# Patient Record
Sex: Female | Born: 1980 | Race: Black or African American | Hispanic: No | Marital: Married | Smoking: Never smoker
Health system: Southern US, Community
[De-identification: ages and names within clinical notes are randomized; demographics above are authoritative.]

## PROBLEM LIST (undated history)

## (undated) DIAGNOSIS — Z9889 Other specified postprocedural states: Secondary | ICD-10-CM

## (undated) DIAGNOSIS — R112 Nausea with vomiting, unspecified: Secondary | ICD-10-CM

## (undated) DIAGNOSIS — B977 Papillomavirus as the cause of diseases classified elsewhere: Secondary | ICD-10-CM

## (undated) DIAGNOSIS — D509 Iron deficiency anemia, unspecified: Secondary | ICD-10-CM

## (undated) DIAGNOSIS — L02419 Cutaneous abscess of limb, unspecified: Secondary | ICD-10-CM

## (undated) DIAGNOSIS — L02429 Furuncle of limb, unspecified: Secondary | ICD-10-CM

## (undated) DIAGNOSIS — F419 Anxiety disorder, unspecified: Secondary | ICD-10-CM

## (undated) DIAGNOSIS — Z973 Presence of spectacles and contact lenses: Secondary | ICD-10-CM

## (undated) DIAGNOSIS — O139 Gestational [pregnancy-induced] hypertension without significant proteinuria, unspecified trimester: Secondary | ICD-10-CM

## (undated) DIAGNOSIS — J111 Influenza due to unidentified influenza virus with other respiratory manifestations: Secondary | ICD-10-CM

## (undated) DIAGNOSIS — N926 Irregular menstruation, unspecified: Principal | ICD-10-CM

## (undated) DIAGNOSIS — E559 Vitamin D deficiency, unspecified: Secondary | ICD-10-CM

## (undated) DIAGNOSIS — L723 Sebaceous cyst: Secondary | ICD-10-CM

## (undated) DIAGNOSIS — Z9851 Tubal ligation status: Secondary | ICD-10-CM

## (undated) HISTORY — PX: TUBAL LIGATION: SHX77

## (undated) HISTORY — DX: Gestational (pregnancy-induced) hypertension without significant proteinuria, unspecified trimester: O13.9

## (undated) HISTORY — DX: Cutaneous abscess of limb, unspecified: L02.419

## (undated) HISTORY — DX: Irregular menstruation, unspecified: N92.6

---

## 2000-05-01 ENCOUNTER — Emergency Department (HOSPITAL_COMMUNITY): Admission: EM | Admit: 2000-05-01 | Discharge: 2000-05-01 | Payer: Self-pay | Admitting: Emergency Medicine

## 2000-09-28 ENCOUNTER — Other Ambulatory Visit: Admission: RE | Admit: 2000-09-28 | Discharge: 2000-09-28 | Payer: Self-pay | Admitting: Family Medicine

## 2001-04-06 ENCOUNTER — Other Ambulatory Visit: Admission: RE | Admit: 2001-04-06 | Discharge: 2001-04-06 | Payer: Self-pay | Admitting: Family Medicine

## 2001-07-21 ENCOUNTER — Emergency Department (HOSPITAL_COMMUNITY): Admission: EM | Admit: 2001-07-21 | Discharge: 2001-07-21 | Payer: Self-pay | Admitting: Emergency Medicine

## 2001-09-29 ENCOUNTER — Emergency Department (HOSPITAL_COMMUNITY): Admission: EM | Admit: 2001-09-29 | Discharge: 2001-09-29 | Payer: Self-pay | Admitting: Emergency Medicine

## 2002-03-21 ENCOUNTER — Emergency Department (HOSPITAL_COMMUNITY): Admission: EM | Admit: 2002-03-21 | Discharge: 2002-03-21 | Payer: Self-pay | Admitting: *Deleted

## 2002-03-26 ENCOUNTER — Emergency Department (HOSPITAL_COMMUNITY): Admission: EM | Admit: 2002-03-26 | Discharge: 2002-03-26 | Payer: Self-pay | Admitting: *Deleted

## 2006-01-14 ENCOUNTER — Other Ambulatory Visit: Admission: RE | Admit: 2006-01-14 | Discharge: 2006-01-14 | Payer: Self-pay | Admitting: Family Medicine

## 2006-01-14 ENCOUNTER — Ambulatory Visit: Payer: Self-pay | Admitting: Family Medicine

## 2007-05-18 ENCOUNTER — Encounter: Payer: Self-pay | Admitting: Family Medicine

## 2007-06-08 ENCOUNTER — Encounter: Payer: Self-pay | Admitting: Family Medicine

## 2008-03-14 DIAGNOSIS — J301 Allergic rhinitis due to pollen: Secondary | ICD-10-CM

## 2008-05-13 ENCOUNTER — Inpatient Hospital Stay (HOSPITAL_COMMUNITY): Admission: AD | Admit: 2008-05-13 | Discharge: 2008-05-16 | Payer: Self-pay | Admitting: Obstetrics and Gynecology

## 2008-05-14 ENCOUNTER — Encounter (INDEPENDENT_AMBULATORY_CARE_PROVIDER_SITE_OTHER): Payer: Self-pay | Admitting: Obstetrics and Gynecology

## 2008-05-20 ENCOUNTER — Encounter: Admission: RE | Admit: 2008-05-20 | Discharge: 2008-06-06 | Payer: Self-pay | Admitting: Obstetrics and Gynecology

## 2008-05-31 ENCOUNTER — Inpatient Hospital Stay (HOSPITAL_COMMUNITY): Admission: AD | Admit: 2008-05-31 | Discharge: 2008-05-31 | Payer: Self-pay | Admitting: Obstetrics & Gynecology

## 2009-10-06 ENCOUNTER — Ambulatory Visit: Payer: Self-pay | Admitting: Family Medicine

## 2009-10-06 ENCOUNTER — Other Ambulatory Visit: Admission: RE | Admit: 2009-10-06 | Discharge: 2009-10-06 | Payer: Self-pay | Admitting: Family Medicine

## 2009-10-06 LAB — CONVERTED CEMR LAB
Glucose, Urine, Semiquant: NEGATIVE
Protein, U semiquant: 100
Urobilinogen, UA: 0.2
WBC Urine, dipstick: NEGATIVE

## 2009-10-13 ENCOUNTER — Other Ambulatory Visit: Admission: RE | Admit: 2009-10-13 | Discharge: 2009-10-13 | Payer: Self-pay | Admitting: Family Medicine

## 2009-10-13 ENCOUNTER — Ambulatory Visit: Payer: Self-pay | Admitting: Family Medicine

## 2009-10-13 DIAGNOSIS — R87625 Unsatisfactory cytologic smear of vagina: Secondary | ICD-10-CM

## 2010-02-27 ENCOUNTER — Emergency Department (HOSPITAL_COMMUNITY): Admission: EM | Admit: 2010-02-27 | Discharge: 2010-02-27 | Payer: Self-pay | Admitting: Family Medicine

## 2010-07-07 NOTE — Assessment & Plan Note (Signed)
Summary: pap specimen recollection- room3   Vital Signs:  Patient profile:   30 year old female Menstrual status:  regular Height:      63.5 inches Weight:      141.50 pounds BMI:     24.76 O2 Sat:      99 % on Room air Pulse rate:   79 / minute Resp:     16 per minute BP sitting:   120 / 80  (left arm)  Vitals Entered By: Adella Hare LPN (Oct 13, 9145 3:24 PM) CC: pap specimen recollection Is Patient Diabetic? No Pain Assessment Patient in pain? no        Primary Provider:  Esperanza Sheets PA  CC:  pap specimen recollection.  History of Present Illness: Pt is here today for repeat pap smear collection due to inadequate specimen. No current complaints or concerns.  Current Medications (verified): 1)  Ortho Tri-Cyclen (28) 0.18/0.215/0.25 Mg-35 Mcg Tabs (Norgestim-Eth Estrad Triphasic) .... Take 1 Daily 2)  Zithromax Z-Pak 250 Mg Tabs (Azithromycin) .... Take Once Daily As Directed  Allergies (verified): 1)  ! Pcn  Physical Exam  Genitalia:  Normal introitus for age, no external lesions, no vaginal discharge, mucosa pink and moist, no vaginal or cervical lesions, no vaginal atrophy, no friaility or hemorrhage.   Impression & Recommendations:  Problem # 1:  UNSATISFACTORY VAGINAL CYTOLOGY SMEAR (ICD-795.18) Assessment Comment Only  await pathology results of recollection  Orders: No Charge Patient Arrived (NCPA0) (NCPA0)  Complete Medication List: 1)  Ortho Tri-cyclen (28) 0.18/0.215/0.25 Mg-35 Mcg Tabs (Norgestim-eth estrad triphasic) .... Take 1 daily 2)  Zithromax Z-pak 250 Mg Tabs (Azithromycin) .... Take once daily as directed

## 2010-07-07 NOTE — Letter (Signed)
Summary: Historic Patient File  Historic Patient File   Imported By: Curtis Sites 12/29/2009 15:13:18  _____________________________________________________________________  External Attachment:    Type:   Image     Comment:   External Document

## 2010-07-07 NOTE — Letter (Signed)
Summary: demographic   demographic   Imported By: Curtis Sites 12/29/2009 15:05:07  _____________________________________________________________________  External Attachment:    Type:   Image     Comment:   External Document

## 2010-07-07 NOTE — Letter (Signed)
Summary: history and physical  history and physical   Imported By: Curtis Sites 12/29/2009 15:12:51  _____________________________________________________________________  External Attachment:    Type:   Image     Comment:   External Document

## 2010-07-07 NOTE — Assessment & Plan Note (Signed)
Summary: new patient, physical   Vital Signs:  Patient profile:   30 year old female Menstrual status:  regular LMP:     09/29/2009 Height:      63.5 inches Weight:      137.75 pounds BMI:     24.11 O2 Sat:      100 % on Room air Pulse rate:   85 / minute Resp:     16 per minute BP sitting:   120 / 72  (left arm)  Vitals Entered By: Adella Hare LPN (Oct 06, 452 9:46 AM) CC: new patient/ physical Is Patient Diabetic? No Pain Assessment Patient in pain? no      LMP (date): 09/29/2009     Menstrual Status regular Enter LMP: 09/29/2009 Last PAP Result normal   Primary Provider:  Esperanza Sheets PA  CC:  new patient/ physical.  History of Present Illness: New pt here to establish care with new PCP. Pt is here today for a physical.  Last physical approx 1 1/2 yrs ago.  No prev abn pap.  Took OCP's in past.  No current birth control.  No problems with OCP just didn't continue on them.    Has been having some yellow nasal congestion x 3 weeks.  Hx of seasonal allergies, & has been sneezing, but now has developed thicker yellow mucus. Using Zyrtec for allergies & does help with the allergy syptoms.  Last Td "couple mos" ago.   Current Medications (verified): 1)  None  Allergies (verified): 1)  ! Pcn  Past History:  Past medical, surgical, family and social histories (including risk factors) reviewed for relevance to current acute and chronic problems.  Past Medical History: Reviewed history from 05/18/2007 and no changes required. Current Problems:  ALLERGIC RHINITIS, SEASONAL (ICD-477.0)  Past Surgical History: Reviewed history from 05/18/2007 and no changes required. none  Family History: Reviewed history from 05/18/2007 and no changes required. mom living- htn dad living- htn  one brother- healthy  Social History: Reviewed history from 05/18/2007 and no changes required. 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 ENT:  Complains of nasal congestion; denies earache, postnasal drainage, sinus pressure, and sore throat. CV:  Denies chest pain or discomfort and palpitations. Resp:  Denies cough and shortness of breath. GI:  Denies abdominal pain, bloody stools, change in bowel habits, constipation, dark tarry stools, loss of appetite, nausea, and vomiting. GU:  Denies abnormal vaginal bleeding, discharge, dysuria, and incontinence. MS:  Denies joint pain, low back pain, and mid back pain. Derm:  Denies lesion(s) and rash. Neuro:  Denies numbness and tingling. Psych:  Denies anxiety and depression. Allergy:  Complains of seasonal allergies and sneezing.  Physical Exam  General:  Well-developed,well-nourished,in no acute distress; alert,appropriate and cooperative throughout examination Head:  Normocephalic and atraumatic without obvious abnormalities. No apparent alopecia or balding. Eyes:  No corneal or conjunctival inflammation noted. EOMI. Perrla. Funduscopic exam benign, without hemorrhages, exudates or papilledema. Vision grossly normal. Ears:  External ear exam shows no significant lesions or deformities.  Otoscopic examination reveals clear canals, tympanic membranes are intact bilaterally without bulging, retraction, inflammation or discharge. Hearing is grossly normal bilaterally. Nose:  External nasal examination shows no deformity or inflammation. Nasal mucosa are pink and moist without lesions or exudates. Mouth:  Oral mucosa and oropharynx without lesions or exudates.  Teeth in good repair. Neck:  No deformities, masses, or tenderness noted.no thyromegaly.   Chest Wall:  no deformities and no mass.   Breasts:  No mass, nodules, thickening, tenderness, bulging, retraction, inflamation, nipple discharge or skin changes noted.   Lungs:  Normal respiratory  effort, chest expands symmetrically. Lungs are clear to auscultation, no crackles or wheezes. Heart:  Normal rate and regular rhythm. S1 and S2 normal without gallop, murmur, click, rub or other extra sounds. Abdomen:  Bowel sounds positive,abdomen soft and non-tender without masses, organomegaly or hernias noted. Genitalia:  Normal introitus for age, no external lesions, no vaginal discharge, mucosa pink and moist, no vaginal or cervical lesions, no vaginal atrophy, no friaility or hemorrhage, normal uterus size and position, no adnexal masses or tenderness Pulses:  R posterior tibial normal, R dorsalis pedis normal, L posterior tibial normal, and L dorsalis pedis normal.   Extremities:  No edema. Neurologic:  alert & oriented X3, sensation intact to light touch, gait normal, and DTRs symmetrical and normal.   Skin:  Intact without suspicious lesions or rashes Cervical Nodes:  No lymphadenopathy noted Axillary Nodes:  No palpable lymphadenopathy Psych:  Cognition and judgment appear intact. Alert and cooperative with normal attention span and concentration. No apparent delusions, illusions, hallucinations   Impression & Recommendations:  Problem # 1:  Preventive Health Care (ICD-V70.0) Discussed birth control options & risk of unplanned pregnancy.  Pt agreed to restart OCP's. Continue periodic SBE.  Problem # 2:  ALLERGIC RHINITIS, SEASONAL (ICD-477.0) Assessment: Comment Only Will continue Zyrtec as needed.  Problem # 3:  SINUSITIS, ACUTE (ICD-461.9) Assessment: New  Her updated medication list for this problem includes:    Zithromax Z-pak 250 Mg Tabs (Azithromycin) .Marland Kitchen... Take once daily as directed  Complete Medication List: 1)  Ortho Tri-cyclen (28) 0.18/0.215/0.25 Mg-35 Mcg Tabs (Norgestim-eth estrad triphasic) .... Take 1 daily 2)  Zithromax Z-pak 250 Mg Tabs (Azithromycin) .... Take once daily as directed  Other Orders: Urinalysis ([id number]) Pap Smear (78295)  Patient  Instructions: 1)  Physical in one year.  Recheck sooner as needed. 2)  Continue Zyrtec as needed for allergies. 3)  I have prescribed an antibiotic for your sinuses. 4)  I have prescribed you birth control pills.  Use condoms as back up for the first month your on the pill. Prescriptions: ZITHROMAX Z-PAK 250 MG TABS (AZITHROMYCIN) take once daily as directed  #1 pack x 0   Entered and Authorized by:   Esperanza Sheets PA   Signed by:   Esperanza Sheets PA on 10/06/2009   Method used:   Electronically to        [facility]. [address] [phone]* (retail)       [address] [place], [place]  [address]       Ph: [phone]       Fax: [phone]   RxID:   [id number] ORTHO TRI-CYCLEN (28) 0.18/0.215/0.25 MG-35 MCG TABS (NORGESTIM-ETH ESTRAD TRIPHASIC) take 1 daily  #1 pack x 11   Entered and Authorized by:   Esperanza Sheets PA   Signed by:   Esperanza Sheets PA on 10/06/2009   Method used:   Electronically to        [facility] [address] [phone]* (retail)       [address], [place]  [address]       Ph: [phone]       Fax: [phone]  RxID:   [id number]   Laboratory Results   Urine Tests  Date/Time Received: Oct 06, 2009  Date/Time Reported: Oct 06, 2009   Routine Urinalysis   Color: yellow Appearance: Clear Glucose: negative   (Normal Range: Negative) Bilirubin: negative   (Normal Range: Negative) Ketone: negative   (Normal Range: Negative) Spec. Gravity: 1.025   (Normal Range: 1.003-1.035) Blood: negative   (Normal Range: Negative) pH: 7.0   (Normal Range: 5.0-8.0) Protein: 100   (Normal Range: Negative) Urobilinogen: 0.2   (Normal Range: 0-1) Nitrite: negative   (Normal Range: Negative) Leukocyte Esterace: negative   (Normal Range: Negative)

## 2010-07-07 NOTE — Letter (Signed)
Summary: progress notes  progress notes   Imported By: Curtis Sites 12/29/2009 15:05:53  _____________________________________________________________________  External Attachment:    Type:   Image     Comment:   External Document

## 2010-07-07 NOTE — Letter (Signed)
Summary: labs  labs   Imported By: Curtis Sites 12/29/2009 15:05:30  _____________________________________________________________________  External Attachment:    Type:   Image     Comment:   External Document

## 2010-10-23 NOTE — Discharge Summary (Signed)
[name], [name]              ACCOUNT NO.:  [id number]   MEDICAL RECORD NO.:  [id number]          PATIENT TYPE:  INP   LOCATION:  9312                          FACILITY:  [facility]   PHYSICIAN:  Maxie Better, M.D.DATE OF BIRTH:  27-Nov-1980   DATE OF ADMISSION:  05/13/2008  DATE OF DISCHARGE:  05/16/2008                               DISCHARGE SUMMARY   ADMISSION DIAGNOSES:  1. Severe intrauterine growth restriction.  2. Intrauterine gestation at 35-3/7 weeks.   DISCHARGE DIAGNOSES:  1. Intrauterine gestation at 35-3/7 weeks, delivered.  2. Severe intrauterine growth restriction.   PROCEDURE:  Spontaneous vaginal delivery.   HISTORY OF PRESENT ILLNESS:  A 30 year old gravida 1, para 0 female at  35-3/7 weeks, admitted for induction of labor secondary to intrauterine  growth restriction.  Ultrasound in the office revealed the fetus to be 3  pounds 8 ounces with 0 percentile growth, on prior scan on April 16, 2008, was 3 pounds 1 ounce, which was at the 22nd percentile at that  time.  Biophysical profile was normal.  Dopplers were normal.  Group B  strep culture had been done on May 13, 2008, had not been resulted.  The patient was followed for serial growth and had been stressed upon to  increase her protein intake and her weight gain.  The patient was a  nonsmoker.  She had no substance use, and she did not have hypertension.   HOSPITAL COURSE:  The patient was admitted for cervical ripening.  She  was started on clindamycin due to unknown group B strep status, and her  blood pressure on admission was 154/90, which was thought possibly  secondary to anxiety over the resultant plan for delivery.  However, PIH  labs obtained on May 14, 2008, were normal.  The patient had Cytotec  placement.  She subsequently had spontaneous rupture of membranes.  Her  blood pressure was on May 14, 2008, 160/92.  PIH labs, as stated,  were normal.  She was 4-5, 80%, -1.   Internal pressure catheter,  internal scalp electrodes were all placed.  She was contracting every 2-  6 minutes.  She was in active labor.  The patient subsequently  progressed quickly.  She had some variable decelerations.  She  subsequently delivered a live female, cord around the neck times one  reducible over a second-degree perineal laceration, Apgars of 8 and 9.  The placenta was small, intact, was sent to Pathology.  Her perineal  laceration was repaired.  The baby went to the NICU due to the weight of  1725 g.  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  Her postpartum course was notable for blood pressure ranging  from 130 to 153 over 72 to 90 on postpartum days #1 and 2.  Her CBC on  postpartum day #1 showed a hemoglobin of 10, hematocrit 28.9, platelet  count of 141,000, and white count of 10.7.  She otherwise was feeling  well.  The patient had no other signs or symptoms of preeclampsia and  was able to be discharged home.  DISPOSITION:  Home.   CONDITION:  Stable.   DISCHARGE MEDICATIONS:  Over-the-counter ibuprofen, prenatal vitamins 1  p.o. daily.   FOLLOWUP APPOINTMENTS:  [facility] in 6 weeks.  Followup for blood  pressure check in a week's time.   DISCHARGE INSTRUCTIONS:  Per the postpartum booklet given.      Maxie Better, M.D.  Electronically Signed     [place] [place]/MED[facility]  D:  06/23/2008  T:  06/23/2008  Job:  [id number]

## 2011-03-12 LAB — CBC
HCT: 28.9 % — ABNORMAL LOW (ref 36.0–46.0)
HCT: 34.8 % — ABNORMAL LOW (ref 36.0–46.0)
Hemoglobin: 11.6 g/dL — ABNORMAL LOW (ref 12.0–15.0)
MCHC: 33.3 g/dL (ref 30.0–36.0)
MCHC: 33.5 g/dL (ref 30.0–36.0)
MCV: 89.4 fL (ref 78.0–100.0)
MCV: 89.6 fL (ref 78.0–100.0)
MCV: 89.6 fL (ref 78.0–100.0)
Platelets: 178 10*3/uL (ref 150–400)
RBC: 3.23 MIL/uL — ABNORMAL LOW (ref 3.87–5.11)
RDW: 14.4 % (ref 11.5–15.5)
WBC: 10.7 10*3/uL — ABNORMAL HIGH (ref 4.0–10.5)

## 2011-03-12 LAB — URINALYSIS, ROUTINE W REFLEX MICROSCOPIC
Bilirubin Urine: NEGATIVE
Ketones, ur: NEGATIVE mg/dL
Protein, ur: >300 mg/dL — AB
Urobilinogen, UA: 0.2 mg/dL (ref 0.0–1.0)

## 2011-03-12 LAB — COMPREHENSIVE METABOLIC PANEL
AST: 26 U/L (ref 0–37)
Albumin: 2.8 g/dL — ABNORMAL LOW (ref 3.5–5.2)
Calcium: 9 mg/dL (ref 8.4–10.5)
Creatinine, Ser: 0.49 mg/dL (ref 0.4–1.2)
GFR calc Af Amer: >60 mL/min (ref >60)
GFR calc non Af Amer: >60 mL/min (ref >60)

## 2011-03-12 LAB — URINE CULTURE: Colony Count: >=100000

## 2011-03-12 LAB — URINE MICROSCOPIC-ADD ON

## 2011-03-12 LAB — URIC ACID: Uric Acid, Serum: 5.6 mg/dL (ref 2.4–7.0)

## 2011-04-09 ENCOUNTER — Encounter: Payer: Self-pay | Admitting: Physician Assistant

## 2011-04-13 ENCOUNTER — Ambulatory Visit (INDEPENDENT_AMBULATORY_CARE_PROVIDER_SITE_OTHER): Payer: Commercial Managed Care - PPO | Admitting: Family Medicine

## 2011-04-13 ENCOUNTER — Other Ambulatory Visit: Payer: Self-pay | Admitting: Family Medicine

## 2011-04-13 ENCOUNTER — Encounter: Payer: Self-pay | Admitting: Family Medicine

## 2011-04-13 VITALS — BP 130/80 | HR 97 | Resp 16 | Ht 63.5 in | Wt 149.4 lb

## 2011-04-13 DIAGNOSIS — Z13220 Encounter for screening for lipoid disorders: Secondary | ICD-10-CM

## 2011-04-13 DIAGNOSIS — J309 Allergic rhinitis, unspecified: Secondary | ICD-10-CM

## 2011-04-13 DIAGNOSIS — M899 Disorder of bone, unspecified: Secondary | ICD-10-CM

## 2011-04-13 DIAGNOSIS — M949 Disorder of cartilage, unspecified: Secondary | ICD-10-CM

## 2011-04-13 DIAGNOSIS — J301 Allergic rhinitis due to pollen: Secondary | ICD-10-CM

## 2011-04-13 DIAGNOSIS — E559 Vitamin D deficiency, unspecified: Secondary | ICD-10-CM

## 2011-04-13 DIAGNOSIS — E663 Overweight: Secondary | ICD-10-CM

## 2011-04-13 DIAGNOSIS — Z6825 Body mass index (BMI) 25.0-25.9, adult: Secondary | ICD-10-CM

## 2011-04-13 DIAGNOSIS — R5383 Other fatigue: Secondary | ICD-10-CM

## 2011-04-13 DIAGNOSIS — R5381 Other malaise: Secondary | ICD-10-CM

## 2011-04-13 DIAGNOSIS — N63 Unspecified lump in unspecified breast: Secondary | ICD-10-CM

## 2011-04-13 LAB — LIPID PANEL
LDL Cholesterol: 124 mg/dL — ABNORMAL HIGH (ref 0–99)
Total CHOL/HDL Ratio: 4.6 Ratio
VLDL: 24 mg/dL (ref 0–40)

## 2011-04-13 LAB — BASIC METABOLIC PANEL
BUN: 14 mg/dL (ref 6–23)
CO2: 26 mEq/L (ref 19–32)
Chloride: 104 mEq/L (ref 96–112)
Creat: 0.69 mg/dL (ref 0.50–1.10)
Potassium: 4 mEq/L (ref 3.5–5.3)

## 2011-04-13 LAB — CBC WITH DIFFERENTIAL/PLATELET
Eosinophils Relative: 2 % (ref 0–5)
HCT: 34 % — ABNORMAL LOW (ref 36.0–46.0)
Hemoglobin: 10.5 g/dL — ABNORMAL LOW (ref 12.0–15.0)
Lymphocytes Relative: 28 % (ref 12–46)
Lymphs Abs: 2.1 10*3/uL (ref 0.7–4.0)
MCV: 81.7 fL (ref 78.0–100.0)
Monocytes Absolute: 0.6 10*3/uL (ref 0.1–1.0)
Monocytes Relative: 8 % (ref 3–12)
Neutro Abs: 4.6 10*3/uL (ref 1.7–7.7)
RBC: 4.16 MIL/uL (ref 3.87–5.11)
WBC: 7.4 10*3/uL (ref 4.0–10.5)

## 2011-04-13 MED ORDER — FLUTICASONE PROPIONATE 50 MCG/ACT NA SUSP: 2.0000 | Freq: Every day | NASAL | Status: DC

## 2011-04-13 NOTE — Progress Notes (Signed)
  Subjective:    Patient ID: [name], female    DOB: 04/19/1981, 30 y.o.   MRN: [id number]  HPI The PT is here for follow up and re-evaluation of chronic medical conditions, medication management and review of any available recent lab and radiology data.  Preventive health is updated, specifically  Cancer screening and Immunization.   C/o uncontrolled allergy symptoms and requests that a nasal spray be prescribed, c/o nasal congestion with clear drainage, denies fever, chills, sore throat or cough. C/o enlarged left breast which she has been unaware of in the past, noticed around the time of her last cycle, also c/o lump in left axilla which is non tender. No known family /o breast cancer. Not currently on an exercise regime, eating habits need improvement, now overweight    Review of Systems See HPI Denies recent fever or chills. Denies chest congestion, productive cough or wheezing. Denies chest pains, palpitations and leg swelling Denies abdominal pain, nausea, vomiting,diarrhea or constipation.   Denies dysuria, frequency, hesitancy or incontinence. Denies joint pain, swelling and limitation in mobility. Denies headaches, seizures, numbness, or tingling. Denies depression, anxiety or insomnia. Denies skin break down or rash.        Objective:   Physical Exam  Patient alert and oriented and in no cardiopulmonary distress.  HEENT: No facial asymmetry, EOMI, no sinus tenderness,  oropharynx pink and moist.  Neck supple no adenopathy.erythema and edema of nasal mucosa.  Chest: Clear to auscultation bilaterally. Breast: no palpable mass in either breast. Left breast visibly larger than right. Lump in left axilla. No nipple d/c CVS: S1, S2 no murmurs, no S3.  ABD: Soft non tender. Bowel sounds normal.  Ext: No edema  MS: Adequate ROM spine, shoulders, hips and knees.  Skin: Intact, no ulcerations or rash noted.  Psych: Good eye contact, normal affect. Memory  intact not anxious or depressed appearing.  CNS: CN 2-12 intact, power, tone and sensation normal throughout.       Assessment & Plan:

## 2011-04-13 NOTE — Patient Instructions (Addendum)
CPE in 2 to 3 months.  Fasting cbc, chem 7, lipid, tsh and vit D today   It is important that you exercise regularly at least 30 minutes 5 times a week. If you develop chest pain, have severe difficulty breathing, or feel very tired, stop exercising immediately and seek medical attention    A healthy diet is rich in fruit, vegetables and whole grains. Poultry fish, nuts and beans are a healthy choice for protein rather then red meat. A low sodium diet and drinking 64 ounces of water daily is generally recommended. Oils and sweet should be limited. Carbohydrates especially for those who are diabetic or overweight, should be limited to 30-45 gram per meal. It is important to eat on a regular schedule, at least 3 times daily. Snacks should be primarily fruits, vegetables or nuts.  Weight loss goal of 8 to 10 pounds in the next 6 months  Medication prescribed for allergies.  You may call in for zyrtec or claritin to [facility] pharmacy

## 2011-04-14 DIAGNOSIS — N63 Unspecified lump in unspecified breast: Secondary | ICD-10-CM | POA: Insufficient documentation

## 2011-04-14 DIAGNOSIS — E559 Vitamin D deficiency, unspecified: Secondary | ICD-10-CM | POA: Insufficient documentation

## 2011-04-14 MED ORDER — VITAMIN D3 1.25 MG (50000 UT) PO CAPS: 50000.0000 [IU] | ORAL_CAPSULE | ORAL | Status: DC

## 2011-04-14 NOTE — Assessment & Plan Note (Signed)
New dx based on lab, will supplement x 6 months

## 2011-04-14 NOTE — Assessment & Plan Note (Signed)
Concerned about lump in left axilla, not painful, and enlarged left breast, will refer for [place]

## 2011-04-19 LAB — ANEMIA PANEL
Ferritin: 11 ng/mL (ref 10–291)
Folate: 9.7 ng/mL
TIBC: 402 ug/dL (ref 250–470)
UIBC: 340 ug/dL (ref 125–400)
Vitamin B-12: 864 pg/mL (ref 211–911)

## 2011-04-21 ENCOUNTER — Other Ambulatory Visit (HOSPITAL_COMMUNITY): Payer: Self-pay | Admitting: Family Medicine

## 2011-04-21 ENCOUNTER — Ambulatory Visit (HOSPITAL_COMMUNITY)
Admission: RE | Admit: 2011-04-21 | Discharge: 2011-04-21 | Disposition: A | Discharge status: Home / Self Care | Payer: 59 | Source: Ambulatory Visit | Attending: Family Medicine | Admitting: Family Medicine

## 2011-04-21 ENCOUNTER — Other Ambulatory Visit: Payer: Self-pay | Admitting: Family Medicine

## 2011-04-21 DIAGNOSIS — N63 Unspecified lump in unspecified breast: Secondary | ICD-10-CM | POA: Insufficient documentation

## 2011-04-21 DIAGNOSIS — R2230 Localized swelling, mass and lump, unspecified upper limb: Secondary | ICD-10-CM

## 2011-04-21 NOTE — Progress Notes (Signed)
Lab added, patient aware

## 2011-06-09 ENCOUNTER — Encounter: Payer: Self-pay | Admitting: Family Medicine

## 2011-06-17 ENCOUNTER — Encounter: Payer: Commercial Managed Care - PPO | Admitting: Family Medicine

## 2011-09-15 ENCOUNTER — Encounter: Payer: 59 | Admitting: Family Medicine

## 2011-10-15 ENCOUNTER — Encounter: Payer: 59 | Admitting: Family Medicine

## 2011-11-19 ENCOUNTER — Encounter: Payer: 59 | Admitting: Family Medicine

## 2011-12-31 ENCOUNTER — Ambulatory Visit (INDEPENDENT_AMBULATORY_CARE_PROVIDER_SITE_OTHER): Payer: 59 | Admitting: Family Medicine

## 2011-12-31 ENCOUNTER — Encounter: Payer: Self-pay | Admitting: Family Medicine

## 2011-12-31 ENCOUNTER — Other Ambulatory Visit (HOSPITAL_COMMUNITY)
Admission: RE | Admit: 2011-12-31 | Discharge: 2011-12-31 | Disposition: A | Discharge status: Home / Self Care | Payer: 59 | Source: Ambulatory Visit | Attending: Family Medicine | Admitting: Family Medicine

## 2011-12-31 ENCOUNTER — Other Ambulatory Visit: Payer: Self-pay | Admitting: Family Medicine

## 2011-12-31 VITALS — BP 132/78 | HR 75 | Resp 18 | Ht 63.5 in | Wt 149.0 lb

## 2011-12-31 DIAGNOSIS — E559 Vitamin D deficiency, unspecified: Secondary | ICD-10-CM

## 2011-12-31 DIAGNOSIS — Z01419 Encounter for gynecological examination (general) (routine) without abnormal findings: Secondary | ICD-10-CM | POA: Insufficient documentation

## 2011-12-31 DIAGNOSIS — Z139 Encounter for screening, unspecified: Secondary | ICD-10-CM

## 2011-12-31 DIAGNOSIS — D649 Anemia, unspecified: Secondary | ICD-10-CM

## 2011-12-31 DIAGNOSIS — Z0000 Encounter for general adult medical examination without abnormal findings: Secondary | ICD-10-CM

## 2011-12-31 DIAGNOSIS — Z23 Encounter for immunization: Secondary | ICD-10-CM

## 2011-12-31 LAB — BASIC METABOLIC PANEL
Calcium: 9.5 mg/dL (ref 8.4–10.5)
Glucose, Bld: 86 mg/dL (ref 70–99)
Potassium: 4.2 mEq/L (ref 3.5–5.3)
Sodium: 136 mEq/L (ref 135–145)

## 2011-12-31 LAB — LIPID PANEL
Cholesterol: 209 mg/dL — ABNORMAL HIGH (ref 0–200)
HDL: 49 mg/dL (ref >39)
Total CHOL/HDL Ratio: 4.3 Ratio
Triglycerides: 68 mg/dL (ref <150)
VLDL: 14 mg/dL (ref 0–40)

## 2011-12-31 LAB — CBC
Hemoglobin: 10.2 g/dL — ABNORMAL LOW (ref 12.0–15.0)
MCH: 23.2 pg — ABNORMAL LOW (ref 26.0–34.0)
MCHC: 31.2 g/dL (ref 30.0–36.0)
Platelets: 335 10*3/uL (ref 150–400)
RBC: 4.4 MIL/uL (ref 3.87–5.11)

## 2011-12-31 NOTE — Patient Instructions (Addendum)
Annual exam in 1 year.  It is important that you exercise regularly at least 30 minutes 5 times a week. If you develop chest pain, have severe difficulty breathing, or feel very tired, stop exercising immediately and seek medical attention    A healthy diet is rich in fruit, vegetables and whole grains. Poultry fish, nuts and beans are a healthy choice for protein rather then red meat. A low sodium diet and drinking 64 ounces of water daily is generally recommended. Oils and sweet should be limited. Carbohydrates especially for those who are diabetic or overweight, should be limited to 30-45 gram per meal. It is important to eat on a regular schedule, at least 3 times daily. Snacks should be primarily fruits, vegetables or nuts.   CBc, fastiing chem 7, lipid, vit D as soon as possible,  TdAP needed

## 2011-12-31 NOTE — Assessment & Plan Note (Signed)
Discussed the importance of commitment to daily physical activity, healthy diet , and maintaining appropriate weight. general home safety also discussed. No desire for contraception at this time as pt desires to have children

## 2011-12-31 NOTE — Progress Notes (Signed)
  Subjective:    Patient ID: [name], female    DOB: Nov 27, 1980, 31 y.o.   MRN: [id number]  HPI The PT is here for annual exam . Preventive health is updated, specifically  Cancer screening and Immunization.   .  There are no new concerns.  There are no specific complaints       Review of Systems See HPI Denies recent fever or chills. Denies sinus pressure, nasal congestion, ear pain or sore throat. Denies chest congestion, productive cough or wheezing. Denies chest pains, palpitations and leg swelling Denies abdominal pain, nausea, vomiting,diarrhea or constipation.   Denies dysuria, frequency, hesitancy or incontinence. Denies joint pain, swelling and limitation in mobility. Denies headaches, seizures, numbness, or tingling. Denies depression, anxiety or insomnia. Denies skin break down or rash.        Objective:   Physical Exam Pleasant well nourished female, alert and oriented x 3, in no cardio-pulmonary distress. Afebrile. HEENT No facial trauma or asymetry. Sinuses non tender.  EOMI, PERTL, fundoscopic exam is normal, no hemorhage or exudate.  External ears normal, tympanic membranes clear. Oropharynx moist, no exudate, good dentition. Neck: supple, no adenopathy,JVD or thyromegaly.No bruits.  Chest: Clear to ascultation bilaterally.No crackles or wheezes. Non tender to palpation  Breast: No asymetry,no masses. No nipple discharge or inversion. No axillary or supraclavicular adenopathy  Cardiovascular system; Heart sounds normal,  S1 and  S2 ,no S3.  No murmur, or thrill. Apical beat not displaced Peripheral pulses normal.  Abdomen: Soft, non tender, no organomegaly or masses. No bruits. Bowel sounds normal. No guarding, tenderness or rebound.  GU: External genitalia normal. No lesions. Vaginal canal normal.No discharge. Uterus normal size, no adnexal masses, no cervical motion or adnexal tenderness.  Musculoskeletal exam: Full ROM of  spine, hips , shoulders and knees. No deformity ,swelling or crepitus noted. No muscle wasting or atrophy.   Neurologic: Cranial nerves 2 to 12 intact. Power, tone ,sensation and reflexes normal throughout. No disturbance in gait. No tremor.  Skin: Intact, no ulceration, erythema , scaling or rash noted. Pigmentation normal throughout  Psych; Normal mood and affect. Judgement and concentration normal        Assessment & Plan:

## 2012-01-02 ENCOUNTER — Telehealth: Payer: Self-pay | Admitting: Family Medicine

## 2012-01-02 NOTE — Telephone Encounter (Signed)
Pls let pt know cholesterol is slightly too high, give values, Please advise patient Cholesterol is elevated.  They need to increase fresh or frozen fruit and vegetable intake.  Reduce red meats max twice weekly.  Increase white meat, baked, grilled or broiled e.g. Fish, [place] breast and chicken breast.   Egg white boiled is an excellent source of protein and beans cooked without butter or oil.  Work towards exercising at least 5 days/week for 30 minutes each session, to improve the good cholesterol.  Cut back on butter, cheese, oils and the yellow of eggs, preferably drink 1% or skimmed milk.  The healthy oils are olive and canola, use in moderation; The healthiest "butter" substitute is Smart start.    ALSO vit D low needs weekly vit D for 6 months, I am entering historically, pls send after you spk with her.  Also has an anemia panel add on

## 2012-01-03 LAB — IRON: Iron: 10 ug/dL — ABNORMAL LOW (ref 42–145)

## 2012-01-03 LAB — FOLATE: Folate: 3.5 ng/mL

## 2012-01-04 NOTE — Telephone Encounter (Signed)
Patient aware.

## 2012-01-10 NOTE — Addendum Note (Signed)
Addended by: Abner Greenspan on: 01/10/2012 08:57 AM   Modules accepted: Orders

## 2012-03-30 ENCOUNTER — Ambulatory Visit (INDEPENDENT_AMBULATORY_CARE_PROVIDER_SITE_OTHER): Payer: 59 | Admitting: Family Medicine

## 2012-03-30 ENCOUNTER — Encounter: Payer: Self-pay | Admitting: Family Medicine

## 2012-03-30 VITALS — BP 130/68 | HR 102 | Temp 99.3°F | Resp 18 | Ht 63.5 in | Wt 148.1 lb

## 2012-03-30 DIAGNOSIS — J069 Acute upper respiratory infection, unspecified: Secondary | ICD-10-CM

## 2012-03-30 MED ORDER — AZITHROMYCIN 250 MG PO TABS: ORAL_TABLET | ORAL | Status: AC

## 2012-03-30 MED ORDER — CHLORPHENIRAMINE-HYDROCODONE 8-10 MG/5ML PO LQCR: 5.0000 mL | Freq: Two times a day (BID) | ORAL | Status: DC | PRN

## 2012-03-30 MED ORDER — HYDROCOD POLST-CHLORPHEN POLST 10-8 MG/5ML PO LQCR: 5.0000 mL | Freq: Two times a day (BID) | ORAL | Status: DC

## 2012-03-30 NOTE — Progress Notes (Signed)
  Subjective:    Patient ID: [name], female    DOB: 09-25-1980, 31 y.o.   MRN: [id number]  HPI   Patient here with coughing congestion and body aches x1 week. Positive subjective fever. She was sent home from work on Tuesday secondary to illness. Her sore throat is improved however nasal drainage and pressure and cough has not. She's been using Mucinex and severe tach which has not helped. She denies nausea vomiting, diarrhea, rash   Review of Systems - per above   GEN- +fatigue, fever, weight loss,weakness, recent illness HEENT- denies eye drainage, change in vision, +nasal discharge, CVS- denies chest pain, palpitations RESP- denies SOB, +cough, wheeze ABD- denies N/V, change in stools, abd pain MSK- denies joint pain, muscle aches, injury Neuro- denies headache, dizziness, syncope, seizure activity      Objective:   Physical Exam GEN- NAD, alert and oriented x3 HEENT- PERRL, EOMI, non injected sclera, pink conjunctiva, MMM, oropharynx mild injection, TM clear bilat no effusion,  + maxillary sinus tenderness, inflammed turbinates,  Nasal drainage  Neck- Supple, no LAD CVS- RRR, no murmur RESP-CTAB EXT- No edema Pulses- Radial 2+        Assessment & Plan:

## 2012-03-30 NOTE — Patient Instructions (Addendum)
Start antibiotics Plenty of fluids Tylenol for body aches Cough syrup Call if you do not improve F/U Dr. Lodema Hong as previous

## 2012-03-31 DIAGNOSIS — J069 Acute upper respiratory infection, unspecified: Secondary | ICD-10-CM | POA: Insufficient documentation

## 2012-03-31 NOTE — Assessment & Plan Note (Signed)
Will start antibiotics based on duration and no improvement in symptoms, cough syrup given.  Rest, fluids

## 2012-04-29 ENCOUNTER — Encounter (HOSPITAL_COMMUNITY): Payer: Self-pay | Admitting: Emergency Medicine

## 2012-04-29 ENCOUNTER — Emergency Department (HOSPITAL_COMMUNITY)
Admission: EM | Admit: 2012-04-29 | Discharge: 2012-04-29 | Disposition: A | Discharge status: Home / Self Care | Payer: 59 | Attending: Emergency Medicine | Admitting: Emergency Medicine

## 2012-04-29 DIAGNOSIS — R1032 Left lower quadrant pain: Secondary | ICD-10-CM | POA: Insufficient documentation

## 2012-04-29 LAB — URINALYSIS, ROUTINE W REFLEX MICROSCOPIC
Bilirubin Urine: NEGATIVE
Leukocytes, UA: NEGATIVE
Nitrite: NEGATIVE
Specific Gravity, Urine: 1.027 (ref 1.005–1.030)
pH: 6.5 (ref 5.0–8.0)

## 2012-04-29 LAB — PREGNANCY, URINE: Preg Test, Ur: NEGATIVE

## 2012-04-29 LAB — WET PREP, GENITAL
Trich, Wet Prep: NONE SEEN
Yeast Wet Prep HPF POC: NONE SEEN

## 2012-04-29 MED ORDER — HYDROCODONE-ACETAMINOPHEN 5-500 MG PO TABS: 1.0000 | ORAL_TABLET | Freq: Four times a day (QID) | ORAL | Status: DC | PRN

## 2012-04-29 MED ORDER — OXYCODONE-ACETAMINOPHEN 5-325 MG PO TABS
1.0000 | ORAL_TABLET | Freq: Four times a day (QID) | ORAL | Status: DC | PRN
  Administered 2012-04-29: 1 via ORAL
  Filled 2012-04-29: qty 1

## 2012-04-29 NOTE — ED Notes (Signed)
Pt presents to ED with lower abdominal pain that began 30 minutes prior to arrival.  Denies N/V/D.  Rates pain at "10" on a 0 to 10 pain scale.  Pt. Complains of severe cramping also.  Denies pain during urination or other urinary symptoms.

## 2012-04-29 NOTE — ED Notes (Addendum)
Pt reports abdominal pain  X 1 day. Described as cramping. 3/10. States "Ive had cramps, but not this bad for this long". Denies vaginal  Discomfort, discharge, or bleeding.  Denies N/V/D/C. Denies abdominal injury. A.O. X4. Ambulatory. Family at  Bedside. NAD.

## 2012-04-29 NOTE — ED Provider Notes (Signed)
History     CSN: [id number]  Arrival date & time 04/29/12  2028   First MD Initiated Contact with Patient 04/29/12 2053      Chief Complaint  Patient presents with  . Abdominal Pain    (Consider location/radiation/quality/duration/timing/severity/associated sxs/prior treatment) HPI  31 year old female presents with lower abdominal pain.  Patient endorse acute onset of stabbing crampy abdominal pain to her low abdomen while walking today. Pain radiates up to her mid abdomen. Severe in intensity.  Pain improves with leaning forward and worsening with walking.  Denies fever, chills, cp, sob, back pain, dysuria, vaginal discharge or rash.  No treatment tried.  Has had similar sxs in the past that lasted for seconds, but never this intense.  LMP nov 11th.  LBM yesterday and normal.    History reviewed. No pertinent past medical history.  History reviewed. No pertinent past surgical history.  History reviewed. No pertinent family history.  History  Substance Use Topics  . Smoking status: Never Smoker   . Smokeless tobacco: Not on file  . Alcohol Use: No    OB History    Grav Para Term Preterm Abortions TAB SAB Ect Mult Living                  Review of Systems  All other systems reviewed and are negative.    Allergies  Penicillins  Home Medications  No current outpatient prescriptions on file.  BP 148/86  Temp 98.3 F (36.8 C) (Oral)  Resp 18  SpO2 96%  LMP 04/17/2012  Physical Exam  Nursing note and vitals reviewed. Constitutional: She is oriented to person, place, and time. She appears well-developed and well-nourished. No distress.  HENT:  Head: Normocephalic and atraumatic.  Eyes: Conjunctivae normal are normal.  Neck: Normal range of motion. Neck supple.  Cardiovascular: Normal rate and regular rhythm.   Pulmonary/Chest: Effort normal and breath sounds normal. She exhibits no tenderness.  Abdominal: Soft. Bowel sounds are normal. She exhibits no  distension. There is tenderness (moderate tenderness to LLQ without guarding or rebound tenderness, no hernia noted.  ). There is no rebound and no guarding.       No CVA tenderness  Genitourinary: Vagina normal and uterus normal. There is no rash or lesion on the right labia. There is no rash or lesion on the left labia. 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 ). Cervix exhibits no motion tenderness and no discharge. Right adnexum displays no mass and no tenderness. Left adnexum displays no mass and no tenderness. No erythema, tenderness or bleeding around the vagina. No vaginal discharge found.       Chaperone present  Musculoskeletal: Normal range of motion.  Lymphadenopathy:       Right: No inguinal adenopathy present.       Left: No inguinal adenopathy present.  Neurological: She is alert and oriented to person, place, and time.  Skin: Skin is warm. No rash noted.  Psychiatric: She has a normal mood and affect.    ED Course  Procedures (including critical care time)  Labs Reviewed - No data to display No results found.   No diagnosis found.  Results for orders placed during the hospital encounter of 04/29/12  URINALYSIS, ROUTINE W REFLEX MICROSCOPIC      Component Value Range   Color, Urine YELLOW  YELLOW   APPearance CLOUDY (*) CLEAR   Specific Gravity, Urine 1.027  1.005 -  1.030   pH 6.5  5.0 - 8.0   Glucose, UA NEGATIVE  NEGATIVE mg/dL   Hgb urine dipstick NEGATIVE  NEGATIVE   Bilirubin Urine NEGATIVE  NEGATIVE   Ketones, ur NEGATIVE  NEGATIVE mg/dL   Protein, ur NEGATIVE  NEGATIVE mg/dL   Urobilinogen, UA 0.2  0.0 - 1.0 mg/dL   Nitrite NEGATIVE  NEGATIVE   Leukocytes, UA NEGATIVE  NEGATIVE  PREGNANCY, URINE      Component Value Range   Preg Test, Ur NEGATIVE  NEGATIVE  WET PREP, GENITAL      Component Value Range   Yeast Wet Prep HPF POC NONE SEEN  NONE SEEN   Trich, Wet Prep NONE SEEN   NONE SEEN   Clue Cells Wet Prep HPF POC NONE SEEN  NONE SEEN   WBC, Wet Prep HPF POC FEW (*) NONE SEEN   No results found.  1. Lower abdominal pain  MDM  Pt presents with acute onset of pain to her Left adnexal LLQ.  Pain suggestive of ovarian cyst.  Doubt kidney stones, doubt hernia, doubt diverticulosis/diverticulitis.  Will check labs, ua, pelvic exam.  Pain medication given.  Pt otherwise non toxic, abdomen non surgical.     9:57 PM No significant pain or discomfort on pelvic exam, no concern for PID.  Pt has no prior hx of STD.    11:01 PM Pt currently without pain, doubt ovarian torsion.  Wet prep, UA are unremarkable.  Pt has prior hx of ovarian cyst.  CDU PA has reevaluate pt and felt pt stable to be discharge.  [place] offered, pt declined.  Strict return precaution discussed.    BP 148/86  Temp 98.3 F (36.8 C) (Oral)  Resp 18  SpO2 96%  LMP 04/17/2012  I have reviewed nursing notes and vital signs.  I reviewed available ER/hospitalization records thought the EMR   Fayrene Helper, [place] 04/29/12 2303

## 2012-04-29 NOTE — ED Provider Notes (Signed)
11:02 PM Pt seen and exmained by me in CDU. Pt with sudden onset of sharp left adnexal pain today about 1hr prior to the arrival to ED. States pain 10/10. Pain gradually improved while here, and was given percocet which took pain to 0/10. Pt was examined on acute side. UA and pelvic exam done which was unremarkable. Pt placed in CDU waiting on results and reassessment.   Exam: Pt in NAD. AAOx3. PERRLA. Neck is supple. Regular HR and rhythm. Lungs are clear to auscultation bilaterally. Abdomen soft, normal bowel sounds. NO TENDERNESS OVER ABDOMEN. Normal gait. Neurovascularly intact.   Results for orders placed during the hospital encounter of 04/29/12  URINALYSIS, ROUTINE W REFLEX MICROSCOPIC      Component Value Range   Color, Urine YELLOW  YELLOW   APPearance CLOUDY (*) CLEAR   Specific Gravity, Urine 1.027  1.005 - 1.030   pH 6.5  5.0 - 8.0   Glucose, UA NEGATIVE  NEGATIVE mg/dL   Hgb urine dipstick NEGATIVE  NEGATIVE   Bilirubin Urine NEGATIVE  NEGATIVE   Ketones, ur NEGATIVE  NEGATIVE mg/dL   Protein, ur NEGATIVE  NEGATIVE mg/dL   Urobilinogen, UA 0.2  0.0 - 1.0 mg/dL   Nitrite NEGATIVE  NEGATIVE   Leukocytes, UA NEGATIVE  NEGATIVE  PREGNANCY, URINE      Component Value Range   Preg Test, Ur NEGATIVE  NEGATIVE  WET PREP, GENITAL      Component Value Range   Yeast Wet Prep HPF POC NONE SEEN  NONE SEEN   Trich, Wet Prep NONE SEEN  NONE SEEN   Clue Cells Wet Prep HPF POC NONE SEEN  NONE SEEN   WBC, Wet Prep HPF POC FEW (*) NONE SEEN   No results found.  Pt is non tender over the abdomen. She has no pain. Pt has hx of ovarian cyst. Suspect possible ruptured ovarian cyst. Doubt torsion since completely asymptomatic now. Pt given option of pelvic US, which pt refused, stating that she feels good now and wants to go home. Pt given careful precautions to return if symptoms continue or worsen. Pt voiced understanding. Family in the room during this discussion. Doubt GI/intestinal  problem since no bowel problems, no fever. Plan to d/c home.   Filed Vitals:   04/29/12 2040  BP: 148/86  Temp: 98.3 F (36.8 C)  Resp: [address] Whitten Andreoni, [place] 04/29/12 2305

## 2012-04-30 NOTE — ED Provider Notes (Signed)
Medical screening examination/treatment/procedure(s) were performed by non-physician practitioner and as supervising physician I was immediately available for consultation/collaboration.  Adrion Menz R Orell Hurtado, MD 04/30/12 0031 

## 2012-04-30 NOTE — ED Provider Notes (Signed)
Medical screening examination/treatment/procedure(s) were performed by non-physician practitioner and as supervising physician I was immediately available for consultation/collaboration.  Annabeth Tortora R Michaelina Blandino, MD 04/30/12 0036 

## 2012-05-01 LAB — GC/CHLAMYDIA PROBE AMP: CT Probe RNA: NEGATIVE

## 2012-06-01 ENCOUNTER — Encounter: Payer: Self-pay | Admitting: Family Medicine

## 2012-06-01 ENCOUNTER — Ambulatory Visit (INDEPENDENT_AMBULATORY_CARE_PROVIDER_SITE_OTHER): Payer: 59 | Admitting: Family Medicine

## 2012-06-01 VITALS — BP 124/88 | HR 68 | Temp 99.4°F | Resp 16 | Ht 63.5 in | Wt 145.0 lb

## 2012-06-01 DIAGNOSIS — J069 Acute upper respiratory infection, unspecified: Secondary | ICD-10-CM

## 2012-06-01 MED ORDER — AZITHROMYCIN 250 MG PO TABS: ORAL_TABLET | ORAL | Status: AC

## 2012-06-01 NOTE — Assessment & Plan Note (Signed)
Supportive care. Will give her a Z-Pak secondary to improvement and then worsening symptoms. She also has a young child at home. No red flags today. Continue Mucinex and fluids

## 2012-06-01 NOTE — Patient Instructions (Signed)
Start antibiotics Continue mucinex  Plenty of fluids and rest  If you do not improve please call

## 2012-06-01 NOTE — Progress Notes (Signed)
  Subjective:    Patient ID: [name], female    DOB: [birth year], 31 y.o.   MRN: [id number]  HPI  Patient presents with cough with production nausea and fever for the past week. She states it initially she had body aches the first day and had nausea with emesis times one but that resolved. She is positive sick contact with her daughter. She was feeling better and her congestion and phlegm started to clear up however it returned for the past couple days. She's been using Mucinex Zyrtec and Flonase which has been helping some. The coughing improves with the use of Mucinex. She did not get her flu shot  Review of Systems  - per above  GEN- + fatigue, fever, weight loss,weakness, recent illness HEENT- denies eye drainage, change in vision, nasal discharge, CVS- denies chest pain, palpitations RESP- denies SOB, +cough, wheeze ABD- denies N/V, change in stools, abd pain        Objective:   Physical Exam GEN- NAD, alert and oriented x3 HEENT- PERRL, EOMI, non injected sclera, pink conjunctiva, MMM, oropharynx mild injection, TM clear bilat no effusion,  maxillary sinus tenderness, inflammed turbinates,  +Nasal drainage  Neck- Supple, shotty LAD CVS- RRR, no murmur RESP-CTAB EXT- No edema Pulses- Radial 2+         Assessment & Plan:

## 2012-06-07 DIAGNOSIS — L02419 Cutaneous abscess of limb, unspecified: Secondary | ICD-10-CM

## 2012-06-07 HISTORY — PX: INCISE AND DRAIN ABCESS: PRO64

## 2012-06-07 HISTORY — DX: Cutaneous abscess of limb, unspecified: L02.419

## 2012-06-07 NOTE — L&D Delivery Note (Signed)
Delivery Note At 8:07 PM a viable female was delivered via Vaginal, Spontaneous Delivery (Presentation: ; Right Occiput Anterior).  APGAR: 8, 9; weight: pending Placenta status: Intact, Spontaneous.  Cord: 3 vessels with the following complications: None.   Anesthesia: Epidural  Episiotomy: None Lacerations: None Suture Repair: n/a Est. Blood Loss (mL): 250  Mother pushed for 10 mins to deliver viable female infant over intact perineum. Tight nuchal cord x 1 which was delivered through and reduced after delivery of the body. Shoulders and body delivered without difficulty or delay. Baby crying upon delivery and placed on maternal abdomen. Cord clamping delayed until umbilical cord stopped pulsing. Cord then clamped and cut by FOB.  Third stage of labor actively managed with gentle cord traction and Oxytocin. Placenta delivered intact with 3 vessel cord. No perineal or vaginal tears. Mother and infant stable at conclusion of delivery. Counts correct and verified. Mom to postpartum. Infant to couplet care/skin to skin.   Delivery performed by me with direct supervision by Dr. Virgina Norfolk, [place] 04/16/2013, 8:47 PM   I was present for and supervised the delivery of this newborn. I agree with documentation as above.   Alizey Noren, [place]mond Baseman, MD

## 2012-07-03 ENCOUNTER — Encounter: Payer: Self-pay | Admitting: Family Medicine

## 2012-07-03 ENCOUNTER — Encounter (INDEPENDENT_AMBULATORY_CARE_PROVIDER_SITE_OTHER): Payer: Self-pay | Admitting: General Surgery

## 2012-07-03 ENCOUNTER — Ambulatory Visit (INDEPENDENT_AMBULATORY_CARE_PROVIDER_SITE_OTHER): Payer: BC Managed Care – PPO | Admitting: Family Medicine

## 2012-07-03 ENCOUNTER — Ambulatory Visit (INDEPENDENT_AMBULATORY_CARE_PROVIDER_SITE_OTHER): Payer: BC Managed Care – PPO | Admitting: General Surgery

## 2012-07-03 VITALS — BP 118/62 | HR 60 | Temp 98.0°F | Resp 18 | Ht 63.0 in | Wt 139.0 lb

## 2012-07-03 VITALS — BP 128/80 | HR 100 | Resp 18 | Ht 63.5 in | Wt 140.1 lb

## 2012-07-03 DIAGNOSIS — L723 Sebaceous cyst: Secondary | ICD-10-CM

## 2012-07-03 DIAGNOSIS — L089 Local infection of the skin and subcutaneous tissue, unspecified: Secondary | ICD-10-CM

## 2012-07-03 DIAGNOSIS — L0291 Cutaneous abscess, unspecified: Secondary | ICD-10-CM

## 2012-07-03 DIAGNOSIS — L729 Follicular cyst of the skin and subcutaneous tissue, unspecified: Secondary | ICD-10-CM | POA: Insufficient documentation

## 2012-07-03 MED ORDER — DOXYCYCLINE HYCLATE 100 MG PO TABS: 100.0000 mg | ORAL_TABLET | Freq: Two times a day (BID) | ORAL | Status: AC

## 2012-07-03 MED ORDER — IBUPROFEN 800 MG PO TABS: 800.0000 mg | ORAL_TABLET | Freq: Three times a day (TID) | ORAL | Status: DC | PRN

## 2012-07-03 MED ORDER — HYDROCODONE-ACETAMINOPHEN 5-300 MG PO TABS: ORAL_TABLET | ORAL | Status: AC

## 2012-07-03 NOTE — Progress Notes (Signed)
Patient ID: [name], female   DOB: Oct 27, 1980, 32 y.o.   MRN: [id number]  Chief Complaint  Patient presents with  . left axillary  cyst    HPI [name] is a 32 y.o. female.  This patient is referred by Dr. Lodema Hong for evaluation of a left axillary abscess. She said that she had noticed this"boil" under her left arm a few months ago but it hadn't caused any problems until last Thursday. She said at that time it began causing some discomfort and increasing pain throughout the weekend and today with some associated redness.  She denies any fevers but she says that it has had a small amount of drainage from under her arm. She saw her physician today who prescribed some antibiotics she is not picked up the prescription and started taking it yet. HPI  No past medical history on file.  No past surgical history on file.  No family history on file.  Social History History  Substance Use Topics  . Smoking status: Never Smoker   . Smokeless tobacco: Not on file  . Alcohol Use: No    Allergies  Allergen Reactions  . Penicillins Hives and Nausea And Vomiting    Current Outpatient Prescriptions  Medication Sig Dispense Refill  . doxycycline (VIBRA-TABS) 100 MG tablet Take 1 tablet (100 mg total) by mouth 2 (two) times daily.  20 tablet  0  . Hydrocodone-Acetaminophen 5-300 MG TABS One tablet twice daily, as needed, for severe pain. Drowsy side effect  20 each  0  . ibuprofen (ADVIL,MOTRIN) 800 MG tablet Take 1 tablet (800 mg total) by mouth every 8 (eight) hours as needed for pain.  30 tablet  0    Review of Systems Review of Systems All other review of systems negative or noncontributory except as stated in the HPI  Blood pressure 118/62, pulse 60, temperature 98 F (36.7 C), resp. rate 18, height 5\' 3"  (1.6 m), weight 139 lb (63.05 kg).  Physical Exam Physical Exam  Nursing note and vitals reviewed. Constitutional: She is oriented to person, place, and time. She  appears well-developed and well-nourished. No distress.  HENT:  Head: Normocephalic and atraumatic.  Eyes: Conjunctivae normal are normal. Pupils are equal, round, and reactive to light.  Neck: Normal range of motion. Neck supple. No JVD present. No tracheal deviation present.  Cardiovascular: Normal rate.   Pulmonary/Chest: Effort normal.  Musculoskeletal: Normal range of motion.  Neurological: She is alert and oriented to person, place, and time.  Skin: Skin is warm. She is not diaphoretic.       Under her left axilla she has a very tender area of swelling with some associated erythema. She has a very pinpoint area with some purulent drainage in the central area of the swelling. This is most consistent with an axillary abscess. I also performed a high frequency ultrasound of the area which demonstrated a 2 cm fluid collection consistent with abscess in the area of erythema and tenderness  Psychiatric: She has a normal mood and affect. Her behavior is normal. Judgment and thought content normal.    Data Reviewed   Assessment    Left axillary abscess She has a left axillary abscess on both history and physical exam and ultrasound. After I discussed the risks and benefits with the patient, informed consent was obtained. I prepped the area with chlorhexidine solution. I anesthetized the area with 6 cc of 1% lidocaine with epinephrine. Over the fluctuant area of I made  an incision with the immediate return of purulent fluid. Cultures were taken. I made a circular incision over the abscess cavity and evacuated the purulent material. The wound was irrigated and packed with 1/2 inch Nu Gauze and a dressing applied. There were no apparent complications.    Plan    Incision and drainage of left axillary abscess Start antibiotics as prescribed by her primary care physician Pain medications as prescribed by her primary care physician. I recommended twice-daily dressing changes and instructed her  husband how to pack the wound. I will see her back in about one week for repeat evaluation       [name] [name] 07/03/2012, 4:33 PM

## 2012-07-03 NOTE — Assessment & Plan Note (Signed)
Skin infection, with purulent collection, i/D will be of great benefit. Antibiotics prescribed, appt set up for pt to be seen by surgeon this pM for i/D

## 2012-07-03 NOTE — Progress Notes (Signed)
  Subjective:    Patient ID: Margretta Sidle, female    DOB: January 09, 1981, 32 y.o.   MRN: [id number]  HPI 3 day h/o painful swelling in left armopit, has h/o recurrent cysts following shaving, No drainage seems to be enlarging, red . No established fever or chills   Review of Systems See HPI Denies recent fever or chills. Denies sinus pressure, nasal congestion, ear pain or sore throat. Denies chest congestion, productive cough or wheezing. Denies chest pains, palpitations and leg swelling  Denies joint pain, swelling and limitation in mobility. Denies skin break down or rash.        Objective:   Physical Exam Patient alert and oriented and in no cardiopulmonary distress.Pt in pain   HEENT: No facial asymmetry, EOMI, no sinus tenderness,  oropharynx pink and moist.  Neck supple no adenopathy.  Chest: Clear to auscultation bilaterally.  CVS: S1, S2 no murmurs, no S3.  .  Skin:tender swelling , red in left axilla, scant drainage from the wound      Assessment & Plan:

## 2012-07-03 NOTE — Patient Instructions (Addendum)
F/u as before.  You have a skin infection.  Medications are sent to your pharmacy. Please go directly to [facility] in [place] where further care will be provided, you are expected

## 2012-07-08 LAB — WOUND CULTURE

## 2012-07-14 ENCOUNTER — Ambulatory Visit (INDEPENDENT_AMBULATORY_CARE_PROVIDER_SITE_OTHER): Payer: BC Managed Care – PPO | Admitting: General Surgery

## 2012-07-14 ENCOUNTER — Encounter (INDEPENDENT_AMBULATORY_CARE_PROVIDER_SITE_OTHER): Payer: Self-pay | Admitting: General Surgery

## 2012-07-14 VITALS — BP 112/74 | HR 68 | Temp 97.3°F | Resp 16 | Ht 63.0 in | Wt 144.8 lb

## 2012-07-14 DIAGNOSIS — Z5189 Encounter for other specified aftercare: Secondary | ICD-10-CM

## 2012-07-14 DIAGNOSIS — L0291 Cutaneous abscess, unspecified: Secondary | ICD-10-CM

## 2012-07-14 NOTE — Progress Notes (Signed)
Subjective:     Patient ID: [name], female   DOB: Nov 28, 1980, 32 y.o.   MRN: [id number]  HPI This patient follows up status post incision and drainage of left axillary abscess. She says that she is doing much better than previously. She has full range of motion in her arms and is no longer packing the wound. She still has a small amount of drainage from the area but denies any fevers or chills. Her wound cultured Proteus.  Review of Systems     Objective:   Physical Exam She is in no acute distress and nontoxic-appearing her wound has essentially healed although she still has a small pinpoint area of of purulent drainage from the wound.    Assessment:     Status post incision and drainage of left axillary abscess-improving She feels much better and her wound looks much better without any evidence of recurrent infection.  She has a small pinpoint area of drainage concern that this wound may have healed up prior to complete healing of the abscess cavity. However, since she is clinically improving we will see how this heals and I suspect that this will continue to improve. I offered to excise this area and if there is a residual cyst when this inflammatory change has resolved.    Plan:     Continue wound care and finished antibiotics and followup when necessary

## 2012-09-07 ENCOUNTER — Encounter: Payer: Self-pay | Admitting: Family Medicine

## 2012-09-07 ENCOUNTER — Other Ambulatory Visit: Payer: Self-pay | Admitting: Family Medicine

## 2012-09-07 ENCOUNTER — Ambulatory Visit (INDEPENDENT_AMBULATORY_CARE_PROVIDER_SITE_OTHER): Payer: BC Managed Care – PPO | Admitting: Family Medicine

## 2012-09-07 VITALS — BP 108/64 | HR 94 | Resp 18 | Ht 63.5 in | Wt 142.0 lb

## 2012-09-07 DIAGNOSIS — Z3490 Encounter for supervision of normal pregnancy, unspecified, unspecified trimester: Secondary | ICD-10-CM

## 2012-09-07 DIAGNOSIS — Z331 Pregnant state, incidental: Secondary | ICD-10-CM

## 2012-09-07 LAB — CBC WITH DIFFERENTIAL/PLATELET
Basophils Absolute: 0.1 10*3/uL (ref 0.0–0.1)
Basophils Relative: 1 % (ref 0–1)
Hemoglobin: 9.5 g/dL — ABNORMAL LOW (ref 12.0–15.0)
MCHC: 31.5 g/dL (ref 30.0–36.0)
Monocytes Relative: 8 % (ref 3–12)
Neutro Abs: 6.7 10*3/uL (ref 1.7–7.7)
Neutrophils Relative %: 65 % (ref 43–77)

## 2012-09-07 LAB — POCT URINE PREGNANCY: Preg Test, Ur: POSITIVE

## 2012-09-07 NOTE — Progress Notes (Signed)
  Subjective:    Patient ID: [name], female    DOB: 01/09/1981, 32 y.o.   MRN: [id number]  HPI Pt in with an LMP of March 2,2014. She has been trying to conceive for the past 3 month. Home pregnancy test is positive, she is experiencing breast fullness and urinary frequency , no nausea. Started pre natal vitamins. Has to catch up on a bill with her ob before she can be seen , hopes to do so by the end of this monht   Review of Systems See HPI .Denies recent fever or chills. Denies sinus pressure, nasal congestion, ear pain or sore throat.Anticipates Spring allergies, I recommend saline nasal flushes Denies chest congestion, productive cough or wheezing. Denies chest pains, palpitations and leg swelling Denies abdominal pain, nausea, vomiting,diarrhea or constipation.    Denies joint pain, swelling and limitation in mobility. Denies headaches, seizures, numbness, or tingling. Denies depression, anxiety or insomnia.Excited about her pregnacy Denies skin break down or rash.         Objective:   Physical Exam Patient alert and oriented and in no cardiopulmonary distress.  HEENT: No facial asymmetry, EOMI, no sinus tenderness,  oropharynx pink and moist.  Neck supple no adenopathy.  Chest: Clear to auscultation bilaterally.  CVS: S1, S2 no murmurs, no S3.  ABD: Soft non tender. Bowel sounds normal.  Ext: No edema  MS: Adequate ROM spine, shoulders, hips and knees.  Skin: Intact, no ulcerations or rash noted.  Psych: Good eye contact, normal affect. Memory intact not anxious or depressed appearing.  CNS: CN 2-12 intact, power, tone and sensation normal throughout.        Assessment & Plan:

## 2012-09-07 NOTE — Assessment & Plan Note (Addendum)
Urine pregnancy test positive in office. Baseline labs ordered, and will be forwarded to her OB Doc. Pt to continue daily pre natal vitamins

## 2012-09-07 NOTE — Patient Instructions (Addendum)
CONGRATS, keep well.  I will contact Dr Cherly Hensen'  office to see what labs or imaging studies she wants before you get in. I will also let her know you will be there by the end of March, hopefully  Take 1 prenatal vitamin once daily

## 2012-09-08 LAB — BASIC METABOLIC PANEL
Glucose, Bld: 85 mg/dL (ref 70–99)
Potassium: 3.9 mEq/L (ref 3.5–5.3)
Sodium: 133 mEq/L — ABNORMAL LOW (ref 135–145)

## 2012-09-08 LAB — IRON: Iron: 409 ug/dL — ABNORMAL HIGH (ref 42–145)

## 2012-09-08 LAB — RPR

## 2012-09-08 LAB — HIV ANTIBODY (ROUTINE TESTING W REFLEX): HIV: NONREACTIVE

## 2012-09-08 LAB — HCG, QUANTITATIVE, PREGNANCY: hCG, Beta Chain, Quant, S: 10899.9 m[IU]/mL

## 2012-09-15 ENCOUNTER — Telehealth: Payer: Self-pay | Admitting: Family Medicine

## 2012-09-18 ENCOUNTER — Encounter: Payer: Self-pay | Admitting: *Deleted

## 2012-09-18 ENCOUNTER — Other Ambulatory Visit: Payer: Self-pay | Admitting: Obstetrics & Gynecology

## 2012-09-18 ENCOUNTER — Ambulatory Visit (INDEPENDENT_AMBULATORY_CARE_PROVIDER_SITE_OTHER): Payer: BC Managed Care – PPO

## 2012-09-18 DIAGNOSIS — O200 Threatened abortion: Secondary | ICD-10-CM

## 2012-09-18 NOTE — Progress Notes (Signed)
U/S-transvaginal u/s performed, single IUP with + FCA noted FHR=136 BPM, CRL c/w 7.0 wks EDD 05/07/2013, cx long and closed, bilateral adnexa wnl with C.L. On RT, no free fluid noted, no areas of hemm noted on u/s

## 2012-09-19 LAB — US OB TRANSVAGINAL

## 2012-09-28 ENCOUNTER — Ambulatory Visit (INDEPENDENT_AMBULATORY_CARE_PROVIDER_SITE_OTHER): Payer: BC Managed Care – PPO | Admitting: Adult Health

## 2012-09-28 ENCOUNTER — Encounter: Payer: Self-pay | Admitting: Adult Health

## 2012-09-28 VITALS — BP 128/82 | Wt 146.0 lb

## 2012-09-28 DIAGNOSIS — O210 Mild hyperemesis gravidarum: Secondary | ICD-10-CM

## 2012-09-28 DIAGNOSIS — Z1389 Encounter for screening for other disorder: Secondary | ICD-10-CM

## 2012-09-28 DIAGNOSIS — Z331 Pregnant state, incidental: Secondary | ICD-10-CM

## 2012-09-28 DIAGNOSIS — Z3481 Encounter for supervision of other normal pregnancy, first trimester: Secondary | ICD-10-CM

## 2012-09-28 DIAGNOSIS — R110 Nausea: Secondary | ICD-10-CM

## 2012-09-28 DIAGNOSIS — O99019 Anemia complicating pregnancy, unspecified trimester: Secondary | ICD-10-CM

## 2012-09-28 LAB — POCT URINALYSIS DIPSTICK
Glucose, UA: NEGATIVE
Nitrite, UA: NEGATIVE
Protein, UA: NEGATIVE
Spec Grav, UA: >=1.03
Urobilinogen, UA: 0.2

## 2012-09-28 MED ORDER — PROMETHAZINE HCL 25 MG PO TABS
25.0000 mg | ORAL_TABLET | Freq: Four times a day (QID) | ORAL | Status: DC | PRN
Start: 2012-09-28 — End: 2012-11-09

## 2012-09-28 NOTE — Patient Instructions (Addendum)
Follow up in 4 weeks for IT/NT and see Drenda Freeze Sign up for my chart Try phenergan for nausea, eat frequently, suck on hard candy  Pregnancy - First Trimester During sexual intercourse, millions of sperm go into the vagina. Only 1 sperm will penetrate and fertilize the female egg while it is in the Fallopian tube. One week later, the fertilized egg implants into the wall of the uterus. An embryo begins to develop into a baby. At 6 to 8 weeks, the eyes and face are formed and the heartbeat can be seen on ultrasound. At the end of 12 weeks (first trimester), all the baby's organs are formed. Now that you are pregnant, you will want to do everything you can to have a healthy baby. Two of the most important things are to get good prenatal care and follow your caregiver's instructions. Prenatal care is all the medical care you receive before the baby's birth. It is given to prevent, find, and treat problems during the pregnancy and childbirth. PRENATAL EXAMS  During prenatal visits, your weight, blood pressure and urine are checked. This is done to make sure you are healthy and progressing normally during the pregnancy.  A pregnant woman should gain 25 to 35 pounds during the pregnancy. However, if you are over weight or underweight, your caregiver will advise you regarding your weight.  Your caregiver will ask and answer questions for you.  Blood work, cervical cultures, other necessary tests and a Pap test are done during your prenatal exams. These tests are done to check on your health and the probable health of your baby. Tests are strongly recommended and done for HIV with your permission. This is the virus that causes AIDS. These tests are done because medications can be given to help prevent your baby from being born with this infection should you have been infected without knowing it. Blood work is also used to find out your blood type, previous infections and follow your blood levels  (hemoglobin).  Low hemoglobin (anemia) is common during pregnancy. Iron and vitamins are given to help prevent this. Later in the pregnancy, blood tests for diabetes will be done along with any other tests if any problems develop. You may need tests to make sure you and the baby are doing well.  You may need other tests to make sure you and the baby are doing well. CHANGES DURING THE FIRST TRIMESTER (THE FIRST 3 MONTHS OF PREGNANCY) Your body goes through many changes during pregnancy. They vary from person to person. Talk to your caregiver about changes you notice and are concerned about. Changes can include:  Your menstrual period stops.  The egg and sperm carry the genes that determine what you look like. Genes from you and your partner are forming a baby. The female genes determine whether the baby is a boy or a girl.  Your body increases in girth and you may feel bloated.  Feeling sick to your stomach (nauseous) and throwing up (vomiting). If the vomiting is uncontrollable, call your caregiver.  Your breasts will begin to enlarge and become tender.  Your nipples may stick out more and become darker.  The need to urinate more. Painful urination may mean you have a bladder infection.  Tiring easily.  Loss of appetite.  Cravings for certain kinds of food.  At first, you may gain or lose a couple of pounds.  You may have changes in your emotions from day to day (excited to be pregnant or concerned something  may go wrong with the pregnancy and baby).  You may have more vivid and strange dreams. HOME CARE INSTRUCTIONS   It is very important to avoid all smoking, alcohol and un-prescribed drugs during your pregnancy. These affect the formation and growth of the baby. Avoid chemicals while pregnant to ensure the delivery of a healthy infant.  Start your prenatal visits by the 12th week of pregnancy. They are usually scheduled monthly at first, then more often in the last 2 months  before delivery. Keep your caregiver's appointments. Follow your caregiver's instructions regarding medication use, blood and lab tests, exercise, and diet.  During pregnancy, you are providing food for you and your baby. Eat regular, well-balanced meals. Choose foods such as meat, fish, milk and other low fat dairy products, vegetables, fruits, and whole-grain breads and cereals. Your caregiver will tell you of the ideal weight gain.  You can help morning sickness by keeping soda crackers at the bedside. Eat a couple before arising in the morning. You may want to use the crackers without salt on them.  Eating 4 to 5 small meals rather than 3 large meals a day also may help the nausea and vomiting.  Drinking liquids between meals instead of during meals also seems to help nausea and vomiting.  A physical sexual relationship may be continued throughout pregnancy if there are no other problems. Problems may be early (premature) leaking of amniotic fluid from the membranes, vaginal bleeding, or belly (abdominal) pain.  Exercise regularly if there are no restrictions. Check with your caregiver or physical therapist if you are unsure of the safety of some of your exercises. Greater weight gain will occur in the last 2 trimesters of pregnancy. Exercising will help:  Control your weight.  Keep you in shape.  Prepare you for labor and delivery.  Help you lose your pregnancy weight after you deliver your baby.  Wear a good support or jogging bra for breast tenderness during pregnancy. This may help if worn during sleep too.  Ask when prenatal classes are available. Begin classes when they are offered.  Do not use hot tubs, steam rooms or saunas.  Wear your seat belt when driving. This protects you and your baby if you are in an accident.  Avoid raw meat, uncooked cheese, cat litter boxes and soil used by cats throughout the pregnancy. These carry germs that can cause birth defects in the  baby.  The first trimester is a good time to visit your dentist for your dental health. Getting your teeth cleaned is OK. Use a softer toothbrush and brush gently during pregnancy.  Ask for help if you have financial, counseling or nutritional needs during pregnancy. Your caregiver will be able to offer counseling for these needs as well as refer you for other special needs.  Do not take any medications or herbs unless told by your caregiver.  Inform your caregiver if there is any mental or physical domestic violence.  Make a list of emergency phone numbers of family, friends, hospital, and police and fire departments.  Write down your questions. Take them to your prenatal visit.  Do not douche.  Do not cross your legs.  If you have to stand for long periods of time, rotate you feet or take small steps in a circle.  You may have more vaginal secretions that may require a sanitary pad. Do not use tampons or scented sanitary pads. MEDICATIONS AND DRUG USE IN PREGNANCY  Take prenatal vitamins as directed. The  vitamin should contain 1 milligram of folic acid. Keep all vitamins out of reach of children. Only a couple vitamins or tablets containing iron may be fatal to a baby or young child when ingested.  Avoid use of all medications, including herbs, over-the-counter medications, not prescribed or suggested by your caregiver. Only take over-the-counter or prescription medicines for pain, discomfort, or fever as directed by your caregiver. Do not use aspirin, ibuprofen, or naproxen unless directed by your caregiver.  Let your caregiver also know about herbs you may be using.  Alcohol is related to a number of birth defects. This includes fetal alcohol syndrome. All alcohol, in any form, should be avoided completely. Smoking will cause low birth rate and premature babies.  Street or illegal drugs are very harmful to the baby. They are absolutely forbidden. A baby born to an addicted mother  will be addicted at birth. The baby will go through the same withdrawal an adult does.  Let your caregiver know about any medications that you have to take and for what reason you take them. MISCARRIAGE IS COMMON DURING PREGNANCY A miscarriage does not mean you did something wrong. It is not a reason to worry about getting pregnant again. Your caregiver will help you with questions you may have. If you have a miscarriage, you may need minor surgery. SEEK MEDICAL CARE IF:  You have any concerns or worries during your pregnancy. It is better to call with your questions if you feel they cannot wait, rather than worry about them. SEEK IMMEDIATE MEDICAL CARE IF:   An unexplained oral temperature above 102 F (38.9 C) develops, or as your caregiver suggests.  You have leaking of fluid from the vagina (birth canal). If leaking membranes are suspected, take your temperature and inform your caregiver of this when you call.  There is vaginal spotting or bleeding. Notify your caregiver of the amount and how many pads are used.  You develop a bad smelling vaginal discharge with a change in the color.  You continue to feel sick to your stomach (nauseated) and have no relief from remedies suggested. You vomit blood or coffee ground-like materials.  You lose more than 2 pounds of weight in 1 week.  You gain more than 2 pounds of weight in 1 week and you notice swelling of your face, hands, feet, or legs.  You gain 5 pounds or more in 1 week (even if you do not have swelling of your hands, face, legs, or feet).  You get exposed to [place] measles and have never had them.  You are exposed to fifth disease or chickenpox.  You develop belly (abdominal) pain. Round ligament discomfort is a common non-cancerous (benign) cause of abdominal pain in pregnancy. Your caregiver still must evaluate this.  You develop headache, fever, diarrhea, pain with urination, or shortness of breath.  You fall or are in a  car accident or have any kind of trauma.  There is mental or physical violence in your home. Document Released: 05/18/2001 Document Revised: 08/16/2011 Document Reviewed: 11/19/2008 [facility] Patient Information 2013 [place], [place].

## 2012-09-28 NOTE — Progress Notes (Signed)
  Subjective:    [name] is a G2P0101 [redacted]w[redacted]d being seen today for her first obstetrical visit.  Her obstetrical history is significant for pregnancy induced hypertension.  Pregnancy history fully reviewed.  Patient reports no complaints.  Filed Vitals:   09/28/12 1126  BP: 128/82  Weight: 146 lb (66.225 kg)    HISTORY: OB History   Grav Para Term Preterm Abortions TAB SAB Ect Mult Living   2 1  1      1      # Outc Date GA Lbr Len/2nd Wgt Sex Del Anes PTL Lv   1 PRE 2009 [redacted]w[redacted]d  3lb12oz(1.701kg) F SVD   Yes   2 CUR              Past Medical History  Diagnosis Date  . Axillary abscess 2014    Left axillary abscess  . Pregnancy induced hypertension    Past Surgical History  Procedure Laterality Date  . Incise and drain abcess  2014   Family History  Problem Relation Age of Onset  . Hypertension Mother   . Hypertension Father   . Diabetes Maternal Aunt   . Diabetes Maternal Grandmother      Exam       Pelvic Exam:    Perineum: deferred   Vulva: deferred   Vagina:  deferred   Uterus         Cervix: normal   Adnexa: deferred   Urinary:  deferred    System: Breast:  Deferred tenderness   Skin: normal coloration and turgor, no rashes    Neurologic: oriented, normal, normal mood   Extremities: normal strength, tone, and muscle mass   HEENT PERRLA   Mouth/Teeth mucous membranes moist, pharynx normal without lesions   Neck supple and no masses   Cardiovascular: regular rate and rhythm   Respiratory:  appears well, vitals normal, no respiratory distress, acyanotic, normal RR   Abdomen: soft, non-tender; bowel sounds normal; no masses,  no organomegaly          Assessment:    Pregnancy: [id number] Patient Active Problem List  Diagnosis  . ALLERGIC RHINITIS, SEASONAL  . Vitamin d deficiency  . Breast lump  . Routine general medical examination at a health care facility  . Pregnancy        Plan:     Initial labs drawn. Prenatal  vitamins. Problem list reviewed and updated. Genetic Screening discussed Integrated Screen: requested.  Ultrasound discussed; fetal survey: requested.  Follow up in 4 weeks.  GRIFFIN,JENNIFER 09/28/2012  Complained of nausea only vomited x1 request meds.will try phenergan 25 mg 1 every 6 hours prn.

## 2012-09-28 NOTE — Progress Notes (Signed)
Pt in today for her New OB. Pt c/o having a thick discharge with no odor for about a week. Pt was given CCNC form and lab consent to read over and sign. Pt's last Pap normal on 01/04/12.

## 2012-09-29 LAB — DRUG SCREEN, URINE, NO CONFIRMATION
Amphetamine Screen, Ur: NEGATIVE
Benzodiazepines.: NEGATIVE
Marijuana Metabolite: NEGATIVE
Phencyclidine (PCP): NEGATIVE
Propoxyphene: NEGATIVE

## 2012-09-29 LAB — SICKLE CELL SCREEN: Sickle Cell Screen: NEGATIVE

## 2012-09-29 LAB — GC/CHLAMYDIA PROBE AMP
CT Probe RNA: NEGATIVE
GC Probe RNA: NEGATIVE

## 2012-09-29 LAB — URINALYSIS
Ketones, ur: NEGATIVE mg/dL
Leukocytes, UA: NEGATIVE
Nitrite: NEGATIVE
Specific Gravity, Urine: 1.021 (ref 1.005–1.030)
Urobilinogen, UA: 0.2 mg/dL (ref 0.0–1.0)
pH: 5.5 (ref 5.0–8.0)

## 2012-09-29 LAB — OXYCODONE SCREEN, UA, RFLX CONFIRM: Oxycodone Screen, Ur: NEGATIVE ng/mL

## 2012-09-29 LAB — CBC
HCT: 33.7 % — ABNORMAL LOW (ref 36.0–46.0)
MCV: 75.2 fL — ABNORMAL LOW (ref 78.0–100.0)
Platelets: 385 10*3/uL (ref 150–400)
RBC: 4.48 MIL/uL (ref 3.87–5.11)
WBC: 13.6 10*3/uL — ABNORMAL HIGH (ref 4.0–10.5)

## 2012-09-29 LAB — HIV ANTIBODY (ROUTINE TESTING W REFLEX): HIV: NONREACTIVE

## 2012-09-29 LAB — TSH: TSH: 0.586 u[IU]/mL (ref 0.350–4.500)

## 2012-09-30 LAB — URINE CULTURE

## 2012-10-02 NOTE — Telephone Encounter (Signed)
Message sent to Dr Emelda Fear re her anemia

## 2012-10-02 NOTE — Telephone Encounter (Signed)
noted 

## 2012-10-03 ENCOUNTER — Encounter: Payer: Self-pay | Admitting: Women's Health

## 2012-10-03 ENCOUNTER — Ambulatory Visit (INDEPENDENT_AMBULATORY_CARE_PROVIDER_SITE_OTHER): Payer: BC Managed Care – PPO | Admitting: Women's Health

## 2012-10-03 VITALS — BP 122/70 | Wt 148.6 lb

## 2012-10-03 DIAGNOSIS — O200 Threatened abortion: Secondary | ICD-10-CM

## 2012-10-03 DIAGNOSIS — Z331 Pregnant state, incidental: Secondary | ICD-10-CM

## 2012-10-03 DIAGNOSIS — Z1389 Encounter for screening for other disorder: Secondary | ICD-10-CM

## 2012-10-03 DIAGNOSIS — O209 Hemorrhage in early pregnancy, unspecified: Secondary | ICD-10-CM

## 2012-10-03 LAB — POCT URINALYSIS DIPSTICK
Leukocytes, UA: NEGATIVE
Nitrite, UA: NEGATIVE
Protein, UA: NEGATIVE

## 2012-10-03 LAB — CYSTIC FIBROSIS DIAGNOSTIC STUDY

## 2012-10-03 NOTE — Progress Notes (Signed)
Here w/ report of heavy bleeding this am, amount of normal menses, and a lot of cramping.  Spec exam: unable to visualize cervix, small amount of pink-tinged d/c on spec blade. SVE: cervix very posterior, unable to reach.  Informal transabdominal u/s +fca @ 176.  Recommended pelvic rest until at least 1wk w/o any bleeding.  Keep next pnv as scheduled.  Call back w/ any concerns.

## 2012-10-03 NOTE — Progress Notes (Signed)
Cramping in lower stomach. Bleeding started this am.

## 2012-10-03 NOTE — Patient Instructions (Signed)
No sex until you do not see any bleeding for 1 week.  Vaginal Bleeding During Pregnancy, First Trimester A small amount of bleeding (spotting) is relatively common in early pregnancy. It usually stops on its own. There are many causes for bleeding or spotting in early pregnancy. Some bleeding may be related to the pregnancy and some may not. Cramping with the bleeding is more serious and concerning. Tell your caregiver if you have any vaginal bleeding.  CAUSES   It is normal in most cases.  The pregnancy ends (miscarriage).  The pregnancy may end (threatened miscarriage).  Infection or inflammation of the cervix.  Growths (polyps) on the cervix.  Pregnancy happens outside of the uterus and in a fallopian tube (tubal pregnancy).  Many tiny cysts in the uterus instead of pregnancy tissue (molar pregnancy). SYMPTOMS  Vaginal bleeding or spotting with or without cramps. DIAGNOSIS  To evaluate the pregnancy, your caregiver may:  Do a pelvic exam.  Take blood tests.  Do an ultrasound. It is very important to follow your caregiver's instructions.  TREATMENT   Evaluation of the pregnancy with blood tests and ultrasound.  Bed rest (getting up to use the bathroom only).  Rho-gam immunization if the mother is Rh negative and the father is Rh positive. HOME CARE INSTRUCTIONS   If your caregiver orders bed rest, you may need to make arrangements for the care of other children and for other responsibilities. However, your caregiver may allow you to continue light activity.  Keep track of the number of pads you use each day, how often you change pads and how soaked (saturated) they are. Write this down.  Do not use tampons. Do not douche.  Do not have sexual intercourse or orgasms until approved by your physician.  Save any tissue that you pass for your caregiver to see.  Take medicine for cramps only with your caregiver's permission.  Do not take aspirin because it can make you  bleed. SEEK IMMEDIATE MEDICAL CARE IF:   You experience severe cramps in your stomach, back or belly (abdomen).  You have an oral temperature above 102 F (38.9 C), not controlled by medicine.  You pass large clots or tissue.  Your bleeding increases or you become light-headed, weak or have fainting episodes.  You develop chills.  You are leaking or have a gush of fluid from your vagina.  You pass out while having a bowel movement. That may mean you have a ruptured tubal pregnancy. Document Released: 03/03/2005 Document Revised: 08/16/2011 Document Reviewed: 09/12/2008 [facility] Patient Information 2013 [place], [place].

## 2012-10-26 ENCOUNTER — Encounter: Payer: BC Managed Care – PPO | Admitting: Advanced Practice Midwife

## 2012-10-26 ENCOUNTER — Other Ambulatory Visit: Payer: BC Managed Care – PPO

## 2012-11-03 IMAGING — MG MM DIGITAL DIAGNOSTIC BILAT
5 series · 5 of 5 positions shown · non-contrast
Comparison: None.

CLINICAL DATA: Palpable left axillary abnormality

DIGITAL DIAGNOSTIC BILATERAL MAMMOGRAM WITH CAD AND LEFT BREAST
ULTRASOUND:

[L CC]
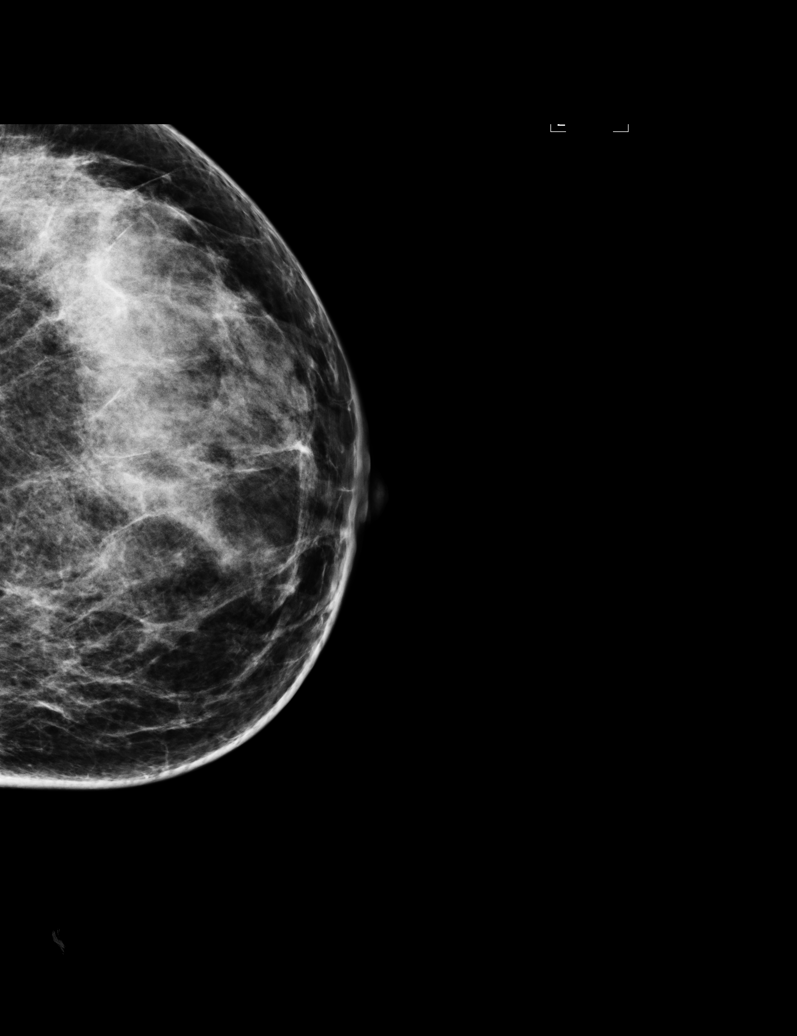

[L MLO]
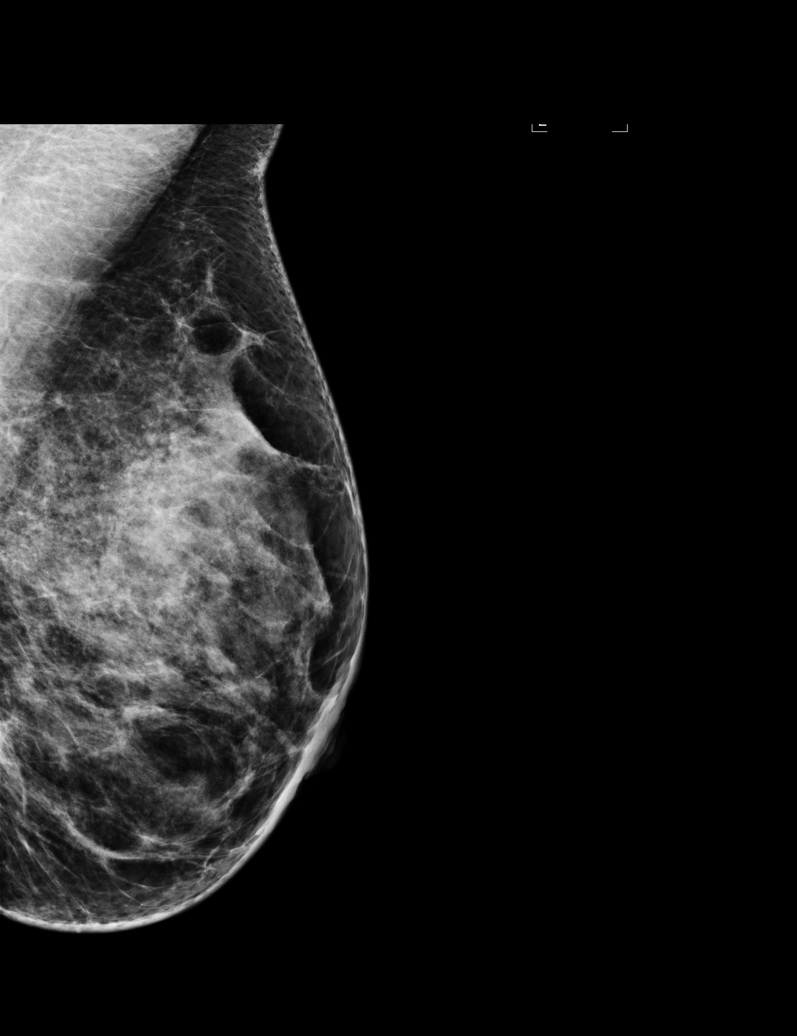

[R CC]
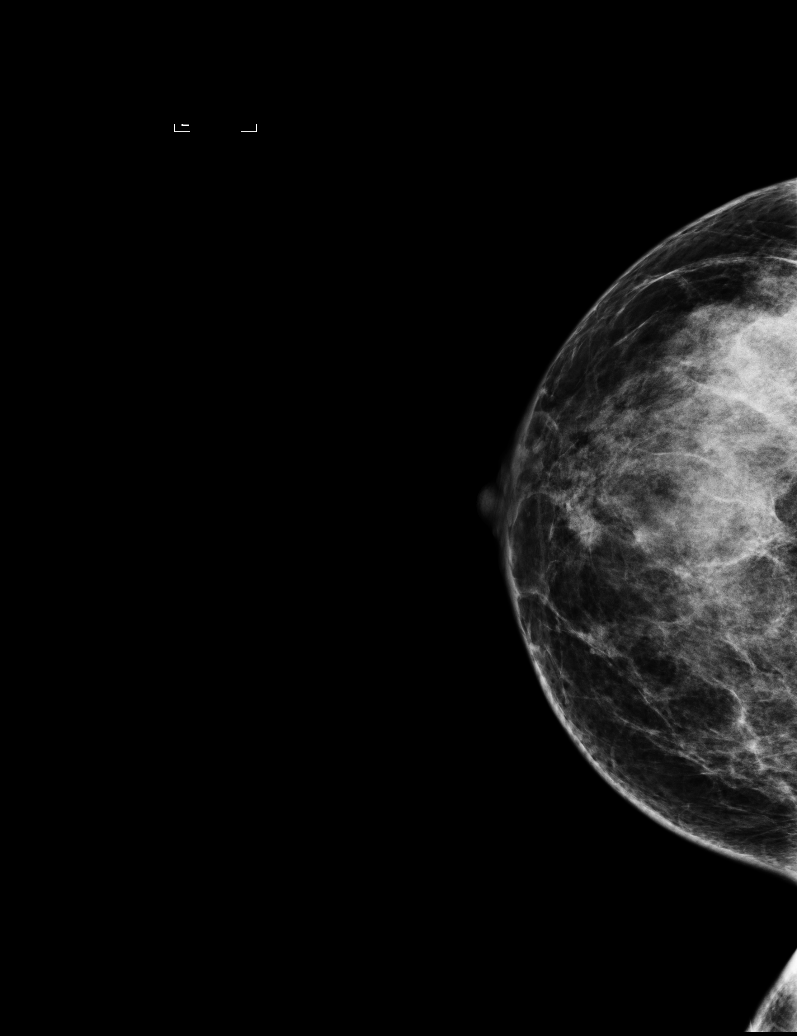

[R MLO]
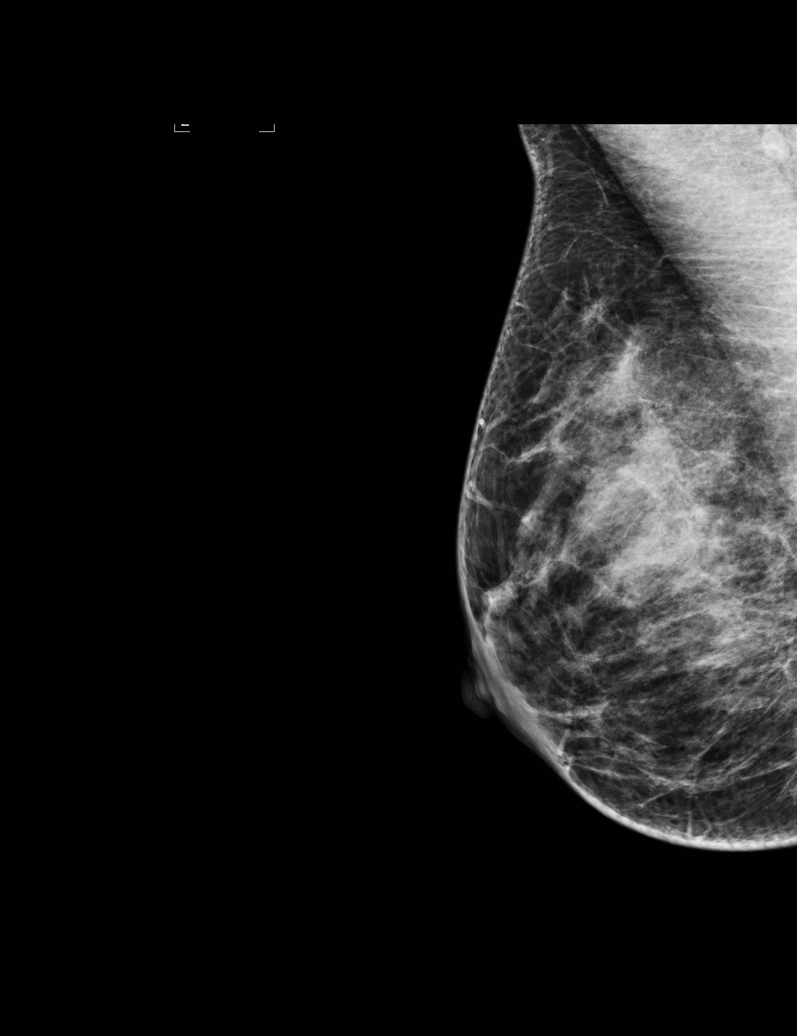

[L ML]
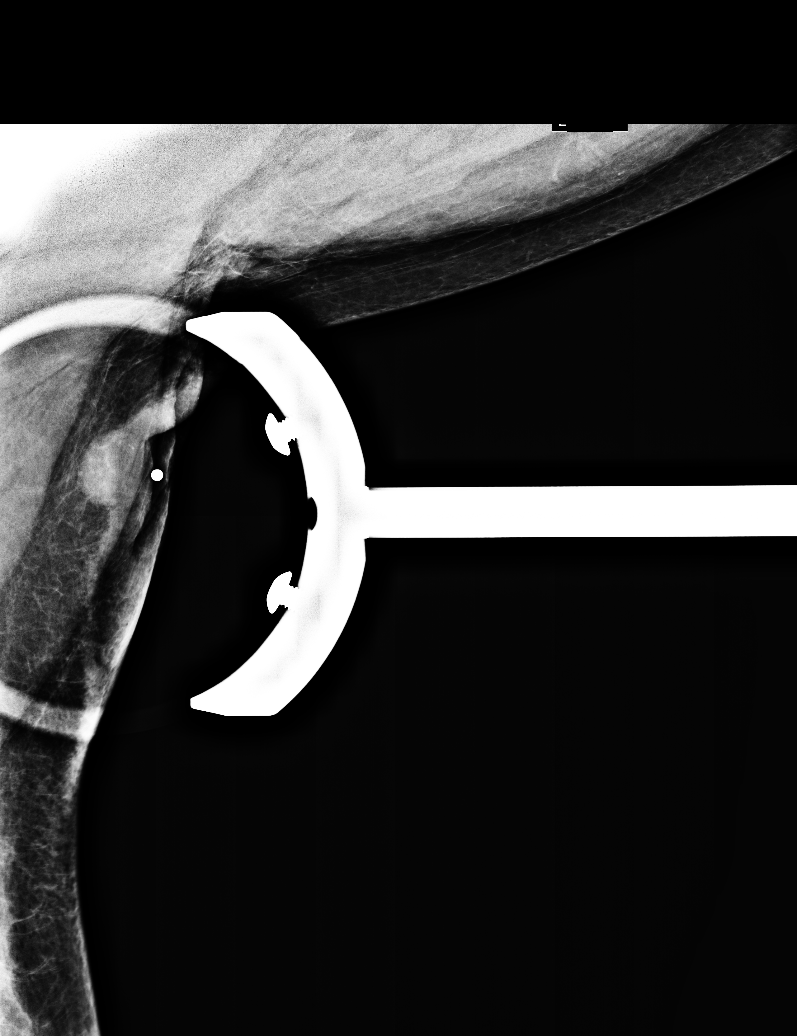

[5 of 5 positions shown; findings below may reference images not displayed]

FINDINGS: There is a dense fibroglandular pattern.  There is no
suspicious mass or malignant-type microcalcifications in either
breast.  Spot tangential view of the area of clinical concern in
the left axilla demonstrates a superficial 1.7 x 1.5 cm mass.
Mammographic images were processed with CAD.

On physical exam, I palpate a discrete, superficial mass in the
left axilla.

Ultrasound is performed, showing there is a 1.6 x 0.9 x 1.6 cm
superficial hypoechoic mass in the left axilla that appears to be
in the skin surface.  No definite sinus tract is seen to the skin
surface.  This likely represents a clogged hair follicle or
sebaceous cyst.  No enlarged axillary adenopathy is detected.
IMPRESSION: Probable benign finding in the left axilla.  Short-term interval
follow-up ultrasound in 2 months is recommended.  If the left
axillary abnormality becomes inflamed antibiotic therapy may be
necessary.

BI-RADS CATEGORY 3:  Probably benign finding(s) - short interval
follow-up suggested.

## 2012-11-09 ENCOUNTER — Ambulatory Visit (INDEPENDENT_AMBULATORY_CARE_PROVIDER_SITE_OTHER): Payer: BC Managed Care – PPO | Admitting: Obstetrics and Gynecology

## 2012-11-09 VITALS — BP 144/72 | Wt <= 1120 oz

## 2012-11-09 DIAGNOSIS — Z3481 Encounter for supervision of other normal pregnancy, first trimester: Secondary | ICD-10-CM

## 2012-11-09 DIAGNOSIS — Z1389 Encounter for screening for other disorder: Secondary | ICD-10-CM

## 2012-11-09 DIAGNOSIS — O210 Mild hyperemesis gravidarum: Secondary | ICD-10-CM

## 2012-11-09 DIAGNOSIS — Z331 Pregnant state, incidental: Secondary | ICD-10-CM

## 2012-11-09 LAB — POCT URINALYSIS DIPSTICK
Blood, UA: NEGATIVE
Leukocytes, UA: NEGATIVE
Nitrite, UA: NEGATIVE

## 2012-11-09 MED ORDER — PROMETHAZINE HCL 25 MG PO TABS: 25.0000 mg | ORAL_TABLET | Freq: Four times a day (QID) | ORAL | Status: DC | PRN

## 2012-11-09 NOTE — Patient Instructions (Addendum)
Add Calcium to diet three times daily.Call back if contractions continue, will add MAG Sulfate.(Slo-mag)

## 2012-11-09 NOTE — Progress Notes (Signed)
S:   C/o aching in bilateral legs, N/V. Requesting refill on Phenergan.  O Will refil rx. Continue PNV.  Add Calcium, if cramps continue will add slo-mag. A: stableIUP 14w 3d jvferg

## 2012-11-22 ENCOUNTER — Encounter: Payer: Self-pay | Admitting: Advanced Practice Midwife

## 2012-11-22 ENCOUNTER — Ambulatory Visit (INDEPENDENT_AMBULATORY_CARE_PROVIDER_SITE_OTHER): Payer: BC Managed Care – PPO | Admitting: Advanced Practice Midwife

## 2012-11-22 ENCOUNTER — Other Ambulatory Visit: Payer: Self-pay | Admitting: Advanced Practice Midwife

## 2012-11-22 VITALS — BP 118/60 | Wt 150.0 lb

## 2012-11-22 DIAGNOSIS — O09219 Supervision of pregnancy with history of pre-term labor, unspecified trimester: Secondary | ICD-10-CM

## 2012-11-22 DIAGNOSIS — O99019 Anemia complicating pregnancy, unspecified trimester: Secondary | ICD-10-CM

## 2012-11-22 DIAGNOSIS — Z331 Pregnant state, incidental: Secondary | ICD-10-CM

## 2012-11-22 DIAGNOSIS — Z1389 Encounter for screening for other disorder: Secondary | ICD-10-CM

## 2012-11-22 LAB — POCT URINALYSIS DIPSTICK
Blood, UA: NEGATIVE
Glucose, UA: NEGATIVE
Nitrite, UA: NEGATIVE

## 2012-11-22 NOTE — Progress Notes (Signed)
Had AFP today.    No c/o at this time.  Routine questions about pregnancy answered.  F/U in 3 weeks for anatomy scan and LROB.

## 2012-11-24 LAB — AFP, QUAD SCREEN
Age Alone: 1:538 {titer}
Down Syndrome Scr Risk Est: <1:38500 {titer}
HCG, Total: 9457 m[IU]/mL
MoM for INH: 0.82
MoM for hCG: 0.37
Open Spina bifida: NEGATIVE
Tri 18 Scr Risk Est: NEGATIVE
Trisomy 18 (Edward) Syndrome Interp.: 1:195 {titer}

## 2012-12-11 ENCOUNTER — Telehealth: Payer: Self-pay | Admitting: *Deleted

## 2012-12-11 NOTE — Telephone Encounter (Signed)
Pt states feeling some pulling sensations, no vaginal bleeding, + FM. Encouraged pt to push fluids, rest, and take tylenol. Pt to call office back if no improvement. Pt also encouraged to go to [facility] for vaginal bleeding and severe pain. Pt verbalized understanding.

## 2012-12-18 ENCOUNTER — Ambulatory Visit (INDEPENDENT_AMBULATORY_CARE_PROVIDER_SITE_OTHER): Payer: BC Managed Care – PPO | Admitting: Women's Health

## 2012-12-18 ENCOUNTER — Ambulatory Visit (INDEPENDENT_AMBULATORY_CARE_PROVIDER_SITE_OTHER): Payer: BC Managed Care – PPO

## 2012-12-18 ENCOUNTER — Encounter: Payer: Self-pay | Admitting: Women's Health

## 2012-12-18 VITALS — BP 130/78 | Wt 154.0 lb

## 2012-12-18 DIAGNOSIS — Z331 Pregnant state, incidental: Secondary | ICD-10-CM

## 2012-12-18 DIAGNOSIS — O09219 Supervision of pregnancy with history of pre-term labor, unspecified trimester: Secondary | ICD-10-CM

## 2012-12-18 DIAGNOSIS — Z3482 Encounter for supervision of other normal pregnancy, second trimester: Secondary | ICD-10-CM

## 2012-12-18 DIAGNOSIS — O99019 Anemia complicating pregnancy, unspecified trimester: Secondary | ICD-10-CM

## 2012-12-18 DIAGNOSIS — Z1389 Encounter for screening for other disorder: Secondary | ICD-10-CM

## 2012-12-18 DIAGNOSIS — Z3480 Encounter for supervision of other normal pregnancy, unspecified trimester: Secondary | ICD-10-CM | POA: Insufficient documentation

## 2012-12-18 LAB — POCT URINALYSIS DIPSTICK
Nitrite, UA: NEGATIVE
Protein, UA: NEGATIVE

## 2012-12-18 NOTE — Progress Notes (Signed)
Anatomy screen complete.all anat. Appears nml. cx closed  = 4.2 cm Female fetus bilat ovs seen

## 2012-12-18 NOTE — Patient Instructions (Addendum)
Pregnancy - Second Trimester The second trimester of pregnancy (3 to 6 months) is a period of rapid growth for you and your baby. At the end of the sixth month, your baby is about 9 inches long and weighs 1 1/2 pounds. You will begin to feel the baby move between 18 and 20 weeks of the pregnancy. This is called quickening. Weight gain is faster. A clear fluid (colostrum) may leak out of your breasts. You may feel small contractions of the womb (uterus). This is known as false labor or Braxton-Hicks contractions. This is like a practice for labor when the baby is ready to be born. Usually, the problems with morning sickness have usually passed by the end of your first trimester. Some women develop small dark blotches (called cholasma, mask of pregnancy) on their face that usually goes away after the baby is born. Exposure to the sun makes the blotches worse. Acne may also develop in some pregnant women and pregnant women who have acne, may find that it goes away. PRENATAL EXAMS  Blood work may continue to be done during prenatal exams. These tests are done to check on your health and the probable health of your baby. Blood work is used to follow your blood levels (hemoglobin). Anemia (low hemoglobin) is common during pregnancy. Iron and vitamins are given to help prevent this. You will also be checked for diabetes between 24 and 28 weeks of the pregnancy. Some of the previous blood tests may be repeated.  The size of the uterus is measured during each visit. This is to make sure that the baby is continuing to grow properly according to the dates of the pregnancy.  Your blood pressure is checked every prenatal visit. This is to make sure you are not getting toxemia.  Your urine is checked to make sure you do not have an infection, diabetes or protein in the urine.  Your weight is checked often to make sure gains are happening at the suggested rate. This is to ensure that both you and your baby are  growing normally.  Sometimes, an ultrasound is performed to confirm the proper growth and development of the baby. This is a test which bounces harmless sound waves off the baby so your caregiver can more accurately determine due dates. Sometimes, a test is done on the amniotic fluid surrounding the baby. This test is called an amniocentesis. The amniotic fluid is obtained by sticking a needle into the belly (abdomen). This is done to check the chromosomes in instances where there is a concern about possible genetic problems with the baby. It is also sometimes done near the end of pregnancy if an early delivery is required. In this case, it is done to help make sure the baby's lungs are mature enough for the baby to live outside of the womb. CHANGES OCCURING IN THE SECOND TRIMESTER OF PREGNANCY Your body goes through many changes during pregnancy. They vary from person to person. Talk to your caregiver about changes you notice that you are concerned about.  During the second trimester, you will likely have an increase in your appetite. It is normal to have cravings for certain foods. This varies from person to person and pregnancy to pregnancy.  Your lower abdomen will begin to bulge.  You may have to urinate more often because the uterus and baby are pressing on your bladder. It is also common to get more bladder infections during pregnancy. You can help this by drinking lots of fluids   and emptying your bladder before and after intercourse.  You may begin to get stretch marks on your hips, abdomen, and breasts. These are normal changes in the body during pregnancy. There are no exercises or medicines to take that prevent this change.  You may begin to develop swollen and bulging veins (varicose veins) in your legs. Wearing support hose, elevating your feet for 15 minutes, 3 to 4 times a day and limiting salt in your diet helps lessen the problem.  Heartburn may develop as the uterus grows and  pushes up against the stomach. Antacids recommended by your caregiver helps with this problem. Also, eating smaller meals 4 to 5 times a day helps.  Constipation can be treated with a stool softener or adding bulk to your diet. Drinking lots of fluids, and eating vegetables, fruits, and whole grains are helpful.  Exercising is also helpful. If you have been very active up until your pregnancy, most of these activities can be continued during your pregnancy. If you have been less active, it is helpful to start an exercise program such as walking.  Hemorrhoids may develop at the end of the second trimester. Warm sitz baths and hemorrhoid cream recommended by your caregiver helps hemorrhoid problems.  Backaches may develop during this time of your pregnancy. Avoid heavy lifting, wear low heal shoes, and practice good posture to help with backache problems.  Some pregnant women develop tingling and numbness of their hand and fingers because of swelling and tightening of ligaments in the wrist (carpel tunnel syndrome). This goes away after the baby is born.  As your breasts enlarge, you may have to get a bigger bra. Get a comfortable, cotton, support bra. Do not get a nursing bra until the last month of the pregnancy if you will be nursing the baby.  You may get a dark line from your belly button to the pubic area called the linea nigra.  You may develop rosy cheeks because of increase blood flow to the face.  You may develop spider looking lines of the face, neck, arms, and chest. These go away after the baby is born. HOME CARE INSTRUCTIONS   It is extremely important to avoid all smoking, herbs, alcohol, and unprescribed drugs during your pregnancy. These chemicals affect the formation and growth of the baby. Avoid these chemicals throughout the pregnancy to ensure the delivery of a healthy infant.  Most of your home care instructions are the same as suggested for the first trimester of your  pregnancy. Keep your caregiver's appointments. Follow your caregiver's instructions regarding medicine use, exercise, and diet.  During pregnancy, you are providing food for you and your baby. Continue to eat regular, well-balanced meals. Choose foods such as meat, fish, milk and other low fat dairy products, vegetables, fruits, and whole-grain breads and cereals. Your caregiver will tell you of the ideal weight gain.  A physical sexual relationship may be continued up until near the end of pregnancy if there are no other problems. Problems could include early (premature) leaking of amniotic fluid from the membranes, vaginal bleeding, abdominal pain, or other medical or pregnancy problems.  Exercise regularly if there are no restrictions. Check with your caregiver if you are unsure of the safety of some of your exercises. The greatest weight gain will occur in the last 2 trimesters of pregnancy. Exercise will help you:  Control your weight.  Get you in shape for labor and delivery.  Lose weight after you have the baby.  Wear   a good support or jogging bra for breast tenderness during pregnancy. This may help if worn during sleep. Pads or tissues may be used in the bra if you are leaking colostrum.  Do not use hot tubs, steam rooms or saunas throughout the pregnancy.  Wear your seat belt at all times when driving. This protects you and your baby if you are in an accident.  Avoid raw meat, uncooked cheese, cat litter boxes, and soil used by cats. These carry germs that can cause birth defects in the baby.  The second trimester is also a good time to visit your dentist for your dental health if this has not been done yet. Getting your teeth cleaned is okay. Use a soft toothbrush. Brush gently during pregnancy.  It is easier to leak urine during pregnancy. Tightening up and strengthening the pelvic muscles will help with this problem. Practice stopping your urination while you are going to the  bathroom. These are the same muscles you need to strengthen. It is also the muscles you would use as if you were trying to stop from passing gas. You can practice tightening these muscles up 10 times a set and repeating this about 3 times per day. Once you know what muscles to tighten up, do not perform these exercises during urination. It is more likely to contribute to an infection by backing up the urine.  Ask for help if you have financial, counseling, or nutritional needs during pregnancy. Your caregiver will be able to offer counseling for these needs as well as refer you for other special needs.  Your skin may become oily. If so, wash your face with mild soap, use non-greasy moisturizer and oil or cream based makeup. MEDICINES AND DRUG USE IN PREGNANCY  Take prenatal vitamins as directed. The vitamin should contain 1 milligram of folic acid. Keep all vitamins out of reach of children. Only a couple vitamins or tablets containing iron may be fatal to a baby or young child when ingested.  Avoid use of all medicines, including herbs, over-the-counter medicines, not prescribed or suggested by your caregiver. Only take over-the-counter or prescription medicines for pain, discomfort, or fever as directed by your caregiver. Do not use aspirin.  Let your caregiver also know about herbs you may be using.  Alcohol is related to a number of birth defects. This includes fetal alcohol syndrome. All alcohol, in any form, should be avoided completely. Smoking will cause low birth rate and premature babies.  Street or illegal drugs are very harmful to the baby. They are absolutely forbidden. A baby born to an addicted mother will be addicted at birth. The baby will go through the same withdrawal an adult does. SEEK MEDICAL CARE IF:  You have any concerns or worries during your pregnancy. It is better to call with your questions if you feel they cannot wait, rather than worry about them. SEEK IMMEDIATE  MEDICAL CARE IF:   An unexplained oral temperature above 102 F (38.9 C) develops, or as your caregiver suggests.  You have leaking of fluid from the vagina (birth canal). If leaking membranes are suspected, take your temperature and tell your caregiver of this when you call.  There is vaginal spotting, bleeding, or passing clots. Tell your caregiver of the amount and how many pads are used. Light spotting in pregnancy is common, especially following intercourse.  You develop a bad smelling vaginal discharge with a change in the color from clear to white.  You continue to feel   sick to your stomach (nauseated) and have no relief from remedies suggested. You vomit blood or coffee ground-like materials.  You lose more than 2 pounds of weight or gain more than 2 pounds of weight over 1 week, or as suggested by your caregiver.  You notice swelling of your face, hands, feet, or legs.  You get exposed to German measles and have never had them.  You are exposed to fifth disease or chickenpox.  You develop belly (abdominal) pain. Round ligament discomfort is a common non-cancerous (benign) cause of abdominal pain in pregnancy. Your caregiver still must evaluate you.  You develop a bad headache that does not go away.  You develop fever, diarrhea, pain with urination, or shortness of breath.  You develop visual problems, blurry, or double vision.  You fall or are in a car accident or any kind of trauma.  There is mental or physical violence at home. Document Released: 05/18/2001 Document Revised: 02/16/2012 Document Reviewed: 11/20/2008 Exit[employer] Patient Information 2014 [employer].  

## 2012-12-18 NOTE — Progress Notes (Signed)
Reports good fm. Denies uc's, lof, vb, urinary frequency, urgency, hesitancy, or dysuria.  No complaints.  Reviewed u/s report, ptl s/s, warning s/s to report.  All questions answered. F/U in 4wks for visit.

## 2012-12-18 NOTE — Progress Notes (Signed)
Pain in lower belly sometimes with walking.

## 2012-12-19 ENCOUNTER — Other Ambulatory Visit: Payer: Self-pay | Admitting: Advanced Practice Midwife

## 2012-12-19 DIAGNOSIS — Z1389 Encounter for screening for other disorder: Secondary | ICD-10-CM

## 2013-01-01 ENCOUNTER — Encounter: Payer: BC Managed Care – PPO | Admitting: Family Medicine

## 2013-01-15 ENCOUNTER — Ambulatory Visit (INDEPENDENT_AMBULATORY_CARE_PROVIDER_SITE_OTHER): Payer: BC Managed Care – PPO | Admitting: Women's Health

## 2013-01-15 ENCOUNTER — Encounter: Payer: Self-pay | Admitting: Women's Health

## 2013-01-15 VITALS — BP 130/60 | Wt 157.8 lb

## 2013-01-15 DIAGNOSIS — Z331 Pregnant state, incidental: Secondary | ICD-10-CM

## 2013-01-15 DIAGNOSIS — O09299 Supervision of pregnancy with other poor reproductive or obstetric history, unspecified trimester: Secondary | ICD-10-CM

## 2013-01-15 DIAGNOSIS — O99019 Anemia complicating pregnancy, unspecified trimester: Secondary | ICD-10-CM

## 2013-01-15 DIAGNOSIS — O09219 Supervision of pregnancy with history of pre-term labor, unspecified trimester: Secondary | ICD-10-CM

## 2013-01-15 DIAGNOSIS — Z3482 Encounter for supervision of other normal pregnancy, second trimester: Secondary | ICD-10-CM

## 2013-01-15 DIAGNOSIS — Z1389 Encounter for screening for other disorder: Secondary | ICD-10-CM

## 2013-01-15 LAB — POCT URINALYSIS DIPSTICK
Ketones, UA: NEGATIVE
Leukocytes, UA: NEGATIVE

## 2013-01-15 NOTE — Progress Notes (Signed)
Reports good fm. Denies uc's, lof, vb, urinary frequency, urgency, hesitancy, or dysuria.  No complaints.  Reviewed ptl s/s, fm.  All questions answered. F/U in 4wks for PN2, growth u/s d/t h/o IUGR, and visit.

## 2013-01-15 NOTE — Patient Instructions (Addendum)
You will have your sugar test next visit.  Please do not eat or drink anything after midnight the night before you come, not even water.  You will be here for at least two hours.     Postpartum Tubal Ligation A postpartum tubal ligation (PPTL) is a procedure that blocks the fallopian tubes right after childbirth or 1 2 days after childbirth. PPTL is done before the uterus returns to its normal location. The procedure is also called a minilaparotomy. By blocking the fallopian tubes, the eggs that are released from the ovaries cannot enter the uterus and sperm cannot reach the egg. A PPTL is done so you will not be able to get pregnant or have a baby.  Although this procedure may be reversed, it should be considered permanent and irreversible. If you want to have future pregnancies, you should not have this procedure. LET YOUR CAREGIVER KNOW ABOUT:  Allergies to food or medicine.  Medicines taken, including vitamins, herbs, eyedrops, over-the-counter medicines, and creams.  Use of steroids (by mouth or creams).  Previous problems with numbing medicines.  History of bleeding problems or blood clots.  Any recent colds or infections.  Previous surgery.  Other health problems, including diabetes and kidney problems. RISKS AND COMPLICATIONS  Infection.  Bleeding.  Injury to other organs.  Anesthetic side effects.  Failure of the procedure.  Ectopic pregnancy.  Future regret about having the procedure done. BEFORE THE PROCEDURE   You may need to sign certain permission forms with your insurance up to 30 days before your due date.  After delivering your baby, you cannot eat or drink anything if the procedure is performed the same day. If you are having the procedure a day after delivering, you may be able to eat and drink until midnight. Your caregiver will give you specific directions depending on your situation. PROCEDURE   If done 1 2 days after delivery:  You will be given a  medicine to make you sleep (general anesthetic) during the procedure.  A tube will be put down your throat to help your breath while under general anesthesia.  A small cut (incision) is made just beneath the belly button.  The fallopian tubes are brought up through the incision.  The fallopian tubes are then sealed, tied, or cut.  If done after a caesarean delivery:  Tubal ligation is done through the incision already made (after the baby is delivered).  Once the tubes are blocked, the incision is closed with stitches (sutures). A bandage will be placed over the incisions. AFTER THE PROCEDURE  You may have some pain or cramps in the abdominal area for the next 3 7 days.  You will be given pain medicine to ease any discomfort.  You may also feel sick to your stomach if you were given a general anesthetic.  You may have some mild discomfort in the throat. This is from the tube that may have been placed in your throat while you were sleeping.  You may feel tired and should rest the remainder of the day. Document Released: 05/24/2005 Document Revised: 11/23/2011 Document Reviewed: 09/04/2011 [employer] Patient Information 2014 [employer].  

## 2013-02-12 ENCOUNTER — Ambulatory Visit (INDEPENDENT_AMBULATORY_CARE_PROVIDER_SITE_OTHER): Payer: BC Managed Care – PPO

## 2013-02-12 ENCOUNTER — Other Ambulatory Visit: Payer: BC Managed Care – PPO

## 2013-02-12 ENCOUNTER — Ambulatory Visit (INDEPENDENT_AMBULATORY_CARE_PROVIDER_SITE_OTHER): Payer: BC Managed Care – PPO | Admitting: Women's Health

## 2013-02-12 ENCOUNTER — Other Ambulatory Visit: Payer: Self-pay | Admitting: Women's Health

## 2013-02-12 VITALS — BP 112/52 | Wt 164.0 lb

## 2013-02-12 DIAGNOSIS — O09219 Supervision of pregnancy with history of pre-term labor, unspecified trimester: Secondary | ICD-10-CM

## 2013-02-12 DIAGNOSIS — Z331 Pregnant state, incidental: Secondary | ICD-10-CM

## 2013-02-12 DIAGNOSIS — Z1389 Encounter for screening for other disorder: Secondary | ICD-10-CM

## 2013-02-12 DIAGNOSIS — O09299 Supervision of pregnancy with other poor reproductive or obstetric history, unspecified trimester: Secondary | ICD-10-CM

## 2013-02-12 DIAGNOSIS — O99019 Anemia complicating pregnancy, unspecified trimester: Secondary | ICD-10-CM

## 2013-02-12 DIAGNOSIS — Z3480 Encounter for supervision of other normal pregnancy, unspecified trimester: Secondary | ICD-10-CM

## 2013-02-12 DIAGNOSIS — O09293 Supervision of pregnancy with other poor reproductive or obstetric history, third trimester: Secondary | ICD-10-CM

## 2013-02-12 LAB — CBC
HCT: 27.6 % — ABNORMAL LOW (ref 36.0–46.0)
MCHC: 33 g/dL (ref 30.0–36.0)
MCV: 80 fL (ref 78.0–100.0)
RDW: 16.1 % — ABNORMAL HIGH (ref 11.5–15.5)

## 2013-02-12 LAB — POCT URINALYSIS DIPSTICK: Ketones, UA: NEGATIVE

## 2013-02-12 NOTE — Progress Notes (Signed)
Reports good fm. Denies uc's, lof, vb, urinary frequency, urgency, hesitancy, or dysuria.  No complaints.  Reviewed today's u/s, ptl s/s, fkc.  All questions answered. PN2 today. F/U in 4wks for growth u/s d/t h/o IUGR, and visit.

## 2013-02-12 NOTE — Patient Instructions (Signed)

## 2013-02-12 NOTE — Progress Notes (Signed)
F/U D/t HX G1 w/ IUGR, Todays scan, meas. C/w dates, at 34%, 2#, 3oz., frank breech position,  afi 15 cm 55% for 28 wks, FHT130/bpm, all anatomy prev. Seen, anatomy appears nml on todays scan, bilat adn appear nml, cx closed 3.5 cm

## 2013-02-12 NOTE — Progress Notes (Signed)
No complaints at this time, PN 2 and ultrasound today

## 2013-02-13 ENCOUNTER — Encounter: Payer: Self-pay | Admitting: Women's Health

## 2013-02-13 ENCOUNTER — Other Ambulatory Visit: Payer: Self-pay | Admitting: Women's Health

## 2013-02-13 ENCOUNTER — Telehealth: Payer: Self-pay | Admitting: Women's Health

## 2013-02-13 DIAGNOSIS — O99019 Anemia complicating pregnancy, unspecified trimester: Secondary | ICD-10-CM | POA: Insufficient documentation

## 2013-02-13 DIAGNOSIS — O99013 Anemia complicating pregnancy, third trimester: Secondary | ICD-10-CM

## 2013-02-13 LAB — HSV 2 ANTIBODY, IGG: HSV 2 Glycoprotein G Ab, IgG: <0.1 IV

## 2013-02-13 NOTE — Telephone Encounter (Signed)
Pt notified of Hgb level, to begin fe supplementation, take w/ OJ, increase high-iron foods.

## 2013-03-12 ENCOUNTER — Ambulatory Visit (INDEPENDENT_AMBULATORY_CARE_PROVIDER_SITE_OTHER): Payer: BC Managed Care – PPO | Admitting: Women's Health

## 2013-03-12 ENCOUNTER — Ambulatory Visit (INDEPENDENT_AMBULATORY_CARE_PROVIDER_SITE_OTHER): Payer: BC Managed Care – PPO

## 2013-03-12 ENCOUNTER — Encounter: Payer: Self-pay | Admitting: Women's Health

## 2013-03-12 VITALS — BP 118/60 | Wt 166.5 lb

## 2013-03-12 DIAGNOSIS — O09219 Supervision of pregnancy with history of pre-term labor, unspecified trimester: Secondary | ICD-10-CM

## 2013-03-12 DIAGNOSIS — O09299 Supervision of pregnancy with other poor reproductive or obstetric history, unspecified trimester: Secondary | ICD-10-CM

## 2013-03-12 DIAGNOSIS — Z3483 Encounter for supervision of other normal pregnancy, third trimester: Secondary | ICD-10-CM

## 2013-03-12 DIAGNOSIS — O99019 Anemia complicating pregnancy, unspecified trimester: Secondary | ICD-10-CM

## 2013-03-12 DIAGNOSIS — O09293 Supervision of pregnancy with other poor reproductive or obstetric history, third trimester: Secondary | ICD-10-CM

## 2013-03-12 DIAGNOSIS — Z3480 Encounter for supervision of other normal pregnancy, unspecified trimester: Secondary | ICD-10-CM

## 2013-03-12 DIAGNOSIS — Z1389 Encounter for screening for other disorder: Secondary | ICD-10-CM

## 2013-03-12 DIAGNOSIS — Z331 Pregnant state, incidental: Secondary | ICD-10-CM

## 2013-03-12 DIAGNOSIS — O99013 Anemia complicating pregnancy, third trimester: Secondary | ICD-10-CM

## 2013-03-12 LAB — POCT URINALYSIS DIPSTICK
Blood, UA: NEGATIVE
Ketones, UA: NEGATIVE
Leukocytes, UA: NEGATIVE

## 2013-03-12 MED ORDER — FERROUS SULFATE 325 (65 FE) MG PO TABS: 325.0000 mg | ORAL_TABLET | Freq: Two times a day (BID) | ORAL | Status: DC

## 2013-03-12 NOTE — Patient Instructions (Signed)
° °  Iron-Rich Diet ° °An iron-rich diet contains foods that are good sources of iron. Iron is an important mineral that helps your body produce hemoglobin. Hemoglobin is a protein in red blood cells that carries oxygen to the body's tissues. Sometimes, the iron level in your blood can be low. This may be caused by: °· A lack of iron in your diet. °· Blood loss. °· Times of growth, such as during pregnancy or during a child's growth and development. °Low levels of iron can cause a decrease in the number of red blood cells. This can result in iron deficiency anemia. Iron deficiency anemia symptoms include: °· Tiredness. °· Weakness. °· Irritability. °· Increased chance of infection. °Here are some recommendations for daily iron intake: °· Males older than 32 years of age need 8 mg of iron per day. °· Women ages 19 to 50 need 18 mg of iron per day. °· Pregnant women need 27 mg of iron per day, and women who are over 19 years of age and breastfeeding need 9 mg of iron per day. °· Women over the age of 50 need 8 mg of iron per day. °SOURCES OF IRON °There are 2 types of iron that are found in food: heme iron and nonheme iron. Heme iron is absorbed by the body better than nonheme iron. Heme iron is found in meat, poultry, and fish. Nonheme iron is found in grains, beans, and vegetables. °Heme Iron Sources °Food / Iron (mg) °· Chicken liver, 3 oz (85 g)/ 10 mg °· Beef liver, 3 oz (85 g)/ 5.5 mg °· Oysters, 3 oz (85 g)/ 8 mg °· Beef, 3 oz (85 g)/ 2 to 3 mg °· Shrimp, 3 oz (85 g)/ 2.8 mg °· Turkey, 3 oz (85 g)/ 2 mg °· Chicken, 3 oz (85 g) / 1 mg °· Fish (tuna, halibut), 3 oz (85 g)/ 1 mg °· Pork, 3 oz (85 g)/ 0.9 mg °Nonheme Iron Sources °Food / Iron (mg) °· Ready-to-eat breakfast cereal, iron-fortified / 3.9 to 7 mg °· Tofu, ½ cup / 3.4 mg °· Kidney beans, ½ cup / 2.6 mg °· Baked potato with skin / 2.7 mg °· Asparagus, ½ cup / 2.2 mg °· Avocado / 2 mg °· Dried peaches, ½ cup / 1.6 mg °· Raisins, ½ cup / 1.5 mg °· Soy milk,  1 cup / 1.5 mg °· Whole-wheat bread, 1 slice / 1.2 mg °· Spinach, 1 cup / 0.8 mg °· Broccoli, ½ cup / 0.6 mg °IRON ABSORPTION °Certain foods can decrease the body's absorption of iron. Try to avoid these foods and beverages while eating meals with iron-containing foods: °· Coffee. °· Tea. °· Fiber. °· Soy. °Foods containing vitamin C can help increase the amount of iron your body absorbs from iron sources, especially from nonheme sources. Eat foods with vitamin C along with iron-containing foods to increase your iron absorption. Foods that are high in vitamin C include many fruits and vegetables. Some good sources are: °· Fresh orange juice. °· Oranges. °· Strawberries. °· Mangoes. °· Grapefruit. °· Red bell peppers. °· Green bell peppers. °· Broccoli. °· Potatoes with skin. °· Tomato juice. °Document Released: 01/05/2005 Document Revised: 08/16/2011 Document Reviewed: 11/12/2010 °[employer]® Patient Information ©2014 [employer]. ° °

## 2013-03-12 NOTE — Progress Notes (Signed)
U/S(32+0wks)-vtx, active fetus, EFW 3 lb 7 oz (24th%tile), Low NL fluid AFI-8.1cm, ant gr2 plac, female fetus

## 2013-03-12 NOTE — Progress Notes (Signed)
Reports good fm. Denies uc's, lof, vb, urinary frequency, urgency, hesitancy, or dysuria.  No complaints.  Not taking fe supplement. Reviewed today's u/s report, ptl s/s, fkc, need for fe supplement, iron rich foods.  Rx'd ferrous sulfate. Decided against BTL, reviewed options, would like mirena. All questions answered. F/U in 2wks for visit.

## 2013-03-26 ENCOUNTER — Ambulatory Visit (INDEPENDENT_AMBULATORY_CARE_PROVIDER_SITE_OTHER): Payer: BC Managed Care – PPO | Admitting: Obstetrics and Gynecology

## 2013-03-26 VITALS — BP 122/50 | Wt 172.2 lb

## 2013-03-26 DIAGNOSIS — O99019 Anemia complicating pregnancy, unspecified trimester: Secondary | ICD-10-CM

## 2013-03-26 DIAGNOSIS — Z331 Pregnant state, incidental: Secondary | ICD-10-CM

## 2013-03-26 DIAGNOSIS — Z3483 Encounter for supervision of other normal pregnancy, third trimester: Secondary | ICD-10-CM

## 2013-03-26 DIAGNOSIS — O09299 Supervision of pregnancy with other poor reproductive or obstetric history, unspecified trimester: Secondary | ICD-10-CM

## 2013-03-26 DIAGNOSIS — O09219 Supervision of pregnancy with history of pre-term labor, unspecified trimester: Secondary | ICD-10-CM

## 2013-03-26 DIAGNOSIS — Z1389 Encounter for screening for other disorder: Secondary | ICD-10-CM

## 2013-03-26 LAB — POCT URINALYSIS DIPSTICK
Blood, UA: NEGATIVE
Glucose, UA: NEGATIVE
Ketones, UA: NEGATIVE
Nitrite, UA: NEGATIVE

## 2013-03-26 NOTE — Progress Notes (Signed)
No complaints at this time.

## 2013-03-26 NOTE — Progress Notes (Signed)
Good fm, no bleeding , no concern, pt questions solicited and answered.

## 2013-04-09 ENCOUNTER — Encounter (INDEPENDENT_AMBULATORY_CARE_PROVIDER_SITE_OTHER): Payer: Self-pay

## 2013-04-09 ENCOUNTER — Ambulatory Visit (INDEPENDENT_AMBULATORY_CARE_PROVIDER_SITE_OTHER): Payer: BC Managed Care – PPO

## 2013-04-09 ENCOUNTER — Ambulatory Visit (INDEPENDENT_AMBULATORY_CARE_PROVIDER_SITE_OTHER): Payer: BC Managed Care – PPO | Admitting: Obstetrics & Gynecology

## 2013-04-09 ENCOUNTER — Other Ambulatory Visit: Payer: Self-pay | Admitting: Obstetrics & Gynecology

## 2013-04-09 ENCOUNTER — Encounter: Payer: Self-pay | Admitting: Obstetrics & Gynecology

## 2013-04-09 ENCOUNTER — Telehealth (HOSPITAL_COMMUNITY): Payer: Self-pay | Admitting: *Deleted

## 2013-04-09 ENCOUNTER — Other Ambulatory Visit: Payer: Self-pay | Admitting: Obstetrics and Gynecology

## 2013-04-09 VITALS — BP 122/80 | Wt 174.0 lb

## 2013-04-09 DIAGNOSIS — O4100X0 Oligohydramnios, unspecified trimester, not applicable or unspecified: Secondary | ICD-10-CM

## 2013-04-09 DIAGNOSIS — O09293 Supervision of pregnancy with other poor reproductive or obstetric history, third trimester: Secondary | ICD-10-CM

## 2013-04-09 DIAGNOSIS — O365931 Maternal care for other known or suspected poor fetal growth, third trimester, fetus 1: Secondary | ICD-10-CM

## 2013-04-09 DIAGNOSIS — O365939 Maternal care for other known or suspected poor fetal growth, third trimester, other fetus: Secondary | ICD-10-CM

## 2013-04-09 DIAGNOSIS — O4103X1 Oligohydramnios, third trimester, fetus 1: Secondary | ICD-10-CM

## 2013-04-09 DIAGNOSIS — Z3483 Encounter for supervision of other normal pregnancy, third trimester: Secondary | ICD-10-CM

## 2013-04-09 DIAGNOSIS — O365990 Maternal care for other known or suspected poor fetal growth, unspecified trimester, not applicable or unspecified: Secondary | ICD-10-CM

## 2013-04-09 DIAGNOSIS — Z1389 Encounter for screening for other disorder: Secondary | ICD-10-CM

## 2013-04-09 DIAGNOSIS — IMO0002 Reserved for concepts with insufficient information to code with codable children: Secondary | ICD-10-CM

## 2013-04-09 DIAGNOSIS — O09299 Supervision of pregnancy with other poor reproductive or obstetric history, unspecified trimester: Secondary | ICD-10-CM

## 2013-04-09 LAB — POCT URINALYSIS DIPSTICK
Glucose, UA: NEGATIVE
Nitrite, UA: NEGATIVE

## 2013-04-09 NOTE — Telephone Encounter (Signed)
Preadmission screen  

## 2013-04-09 NOTE — Progress Notes (Signed)
U/S(36+0wks)-vtx active fetus, EFW 4 lb 7 oz (<3rd%tile [place]), Oligohydramnios AFI-4.2cm, Anterior Gr 2 plac, BPP 6/8 (unable to measure fluid pocket >=2cm), UA Doppler RI- 0.64 & 0.59, female fetus "[name]"

## 2013-04-09 NOTE — Progress Notes (Signed)
Sonogram report reviewed and final report done 6/8 BPP with reactive nst=8/10 AFI/greatest fluid pocket all consistent with oligohydramnios and with IUGR and EFW<3% now, will induce next Monday at 37 weeks as a result, scheduled.  NST on Thursday

## 2013-04-09 NOTE — Addendum Note (Signed)
Addended by: Richardson Chiquito on: 04/09/2013 12:49 PM   Modules accepted: Orders

## 2013-04-10 ENCOUNTER — Other Ambulatory Visit: Payer: Self-pay | Admitting: Obstetrics & Gynecology

## 2013-04-10 ENCOUNTER — Ambulatory Visit: Payer: BC Managed Care – PPO

## 2013-04-10 LAB — OB RESULTS CONSOLE GC/CHLAMYDIA: Gonorrhea: NEGATIVE

## 2013-04-10 LAB — GC/CHLAMYDIA PROBE AMP: GC Probe RNA: NEGATIVE

## 2013-04-11 LAB — STREP B DNA PROBE: GBSP: POSITIVE

## 2013-04-12 ENCOUNTER — Ambulatory Visit (INDEPENDENT_AMBULATORY_CARE_PROVIDER_SITE_OTHER): Payer: BC Managed Care – PPO | Admitting: Obstetrics & Gynecology

## 2013-04-12 ENCOUNTER — Encounter: Payer: Self-pay | Admitting: Obstetrics & Gynecology

## 2013-04-12 VITALS — BP 116/60 | Wt 176.5 lb

## 2013-04-12 DIAGNOSIS — O09219 Supervision of pregnancy with history of pre-term labor, unspecified trimester: Secondary | ICD-10-CM

## 2013-04-12 DIAGNOSIS — IMO0002 Reserved for concepts with insufficient information to code with codable children: Secondary | ICD-10-CM

## 2013-04-12 DIAGNOSIS — O210 Mild hyperemesis gravidarum: Secondary | ICD-10-CM

## 2013-04-12 DIAGNOSIS — Z331 Pregnant state, incidental: Secondary | ICD-10-CM

## 2013-04-12 DIAGNOSIS — O4100X0 Oligohydramnios, unspecified trimester, not applicable or unspecified: Secondary | ICD-10-CM

## 2013-04-12 DIAGNOSIS — O0993 Supervision of high risk pregnancy, unspecified, third trimester: Secondary | ICD-10-CM

## 2013-04-12 DIAGNOSIS — Z1389 Encounter for screening for other disorder: Secondary | ICD-10-CM

## 2013-04-12 DIAGNOSIS — O365990 Maternal care for other known or suspected poor fetal growth, unspecified trimester, not applicable or unspecified: Secondary | ICD-10-CM

## 2013-04-12 DIAGNOSIS — O99019 Anemia complicating pregnancy, unspecified trimester: Secondary | ICD-10-CM

## 2013-04-12 DIAGNOSIS — O09299 Supervision of pregnancy with other poor reproductive or obstetric history, unspecified trimester: Secondary | ICD-10-CM

## 2013-04-12 DIAGNOSIS — O9989 Other specified diseases and conditions complicating pregnancy, childbirth and the puerperium: Secondary | ICD-10-CM

## 2013-04-12 LAB — POCT URINALYSIS DIPSTICK
Ketones, UA: NEGATIVE
Nitrite, UA: NEGATIVE

## 2013-04-12 NOTE — Progress Notes (Signed)
Reactive NST no decels, baby very active Scheduled for induction on Monday BP weight and urine results all reviewed and noted. Patient reports good fetal movement, denies any bleeding and no rupture of membranes symptoms or regular contractions. Patient is without complaints. All questions were answered.

## 2013-04-16 ENCOUNTER — Inpatient Hospital Stay (HOSPITAL_COMMUNITY)
Admission: RE | Admit: 2013-04-16 | Discharge: 2013-04-18 | DRG: 775 | Disposition: A | Discharge status: Home / Self Care | Payer: BC Managed Care – PPO | Source: Ambulatory Visit | Attending: Obstetrics & Gynecology | Admitting: Obstetrics & Gynecology

## 2013-04-16 ENCOUNTER — Encounter (HOSPITAL_COMMUNITY): Payer: BC Managed Care – PPO | Admitting: Anesthesiology

## 2013-04-16 ENCOUNTER — Encounter (HOSPITAL_COMMUNITY): Payer: Self-pay

## 2013-04-16 ENCOUNTER — Inpatient Hospital Stay (HOSPITAL_COMMUNITY): Payer: BC Managed Care – PPO | Admitting: Anesthesiology

## 2013-04-16 VITALS — BP 129/65 | HR 70 | Temp 98.4°F | Resp 17 | Ht 63.0 in | Wt 176.0 lb

## 2013-04-16 DIAGNOSIS — O365990 Maternal care for other known or suspected poor fetal growth, unspecified trimester, not applicable or unspecified: Secondary | ICD-10-CM

## 2013-04-16 DIAGNOSIS — O4100X0 Oligohydramnios, unspecified trimester, not applicable or unspecified: Secondary | ICD-10-CM | POA: Diagnosis present

## 2013-04-16 DIAGNOSIS — O99892 Other specified diseases and conditions complicating childbirth: Secondary | ICD-10-CM | POA: Diagnosis present

## 2013-04-16 DIAGNOSIS — Z22330 Carrier of Group B streptococcus: Secondary | ICD-10-CM

## 2013-04-16 DIAGNOSIS — O09293 Supervision of pregnancy with other poor reproductive or obstetric history, third trimester: Secondary | ICD-10-CM

## 2013-04-16 DIAGNOSIS — O99013 Anemia complicating pregnancy, third trimester: Secondary | ICD-10-CM

## 2013-04-16 LAB — CBC
HCT: 28.7 % — ABNORMAL LOW (ref 36.0–46.0)
Hemoglobin: 9.4 g/dL — ABNORMAL LOW (ref 12.0–15.0)
MCH: 26.1 pg (ref 26.0–34.0)
Platelets: 245 10*3/uL (ref 150–400)
RDW: 15.9 % — ABNORMAL HIGH (ref 11.5–15.5)
WBC: 11.2 10*3/uL — ABNORMAL HIGH (ref 4.0–10.5)

## 2013-04-16 LAB — RPR: RPR Ser Ql: NONREACTIVE

## 2013-04-16 MED ORDER — LIDOCAINE HCL (PF) 1 % IJ SOLN
INTRAMUSCULAR | Status: DC | PRN
  Administered 2013-04-16 (×2): 4 mL

## 2013-04-16 MED ORDER — EPHEDRINE 5 MG/ML INJ
10.0000 mg | INTRAVENOUS | Status: DC | PRN
  Filled 2013-04-16: qty 2

## 2013-04-16 MED ORDER — BENZOCAINE-MENTHOL 20-0.5 % EX AERO
1.0000 "application " | INHALATION_SPRAY | CUTANEOUS | Status: DC | PRN
  Administered 2013-04-17: 1 via TOPICAL
  Filled 2013-04-16: qty 56

## 2013-04-16 MED ORDER — OXYTOCIN BOLUS FROM INFUSION: 500.0000 mL | INTRAVENOUS | Status: DC

## 2013-04-16 MED ORDER — FENTANYL 2.5 MCG/ML BUPIVACAINE 1/10 % EPIDURAL INFUSION (WH - ANES)
14.0000 mL/h | INTRAMUSCULAR | Status: DC | PRN
  Filled 2013-04-16: qty 125

## 2013-04-16 MED ORDER — TERBUTALINE SULFATE 1 MG/ML IJ SOLN: 0.2500 mg | Freq: Once | INTRAMUSCULAR | Status: DC | PRN

## 2013-04-16 MED ORDER — LACTATED RINGERS IV SOLN
INTRAVENOUS | Status: DC
  Administered 2013-04-16: 15:00:00 via INTRAVENOUS

## 2013-04-16 MED ORDER — LANOLIN HYDROUS EX OINT: TOPICAL_OINTMENT | CUTANEOUS | Status: DC | PRN

## 2013-04-16 MED ORDER — PHENYLEPHRINE 40 MCG/ML (10ML) SYRINGE FOR IV PUSH (FOR BLOOD PRESSURE SUPPORT)
80.0000 ug | PREFILLED_SYRINGE | INTRAVENOUS | Status: DC | PRN
  Filled 2013-04-16: qty 10
  Filled 2013-04-16: qty 2

## 2013-04-16 MED ORDER — DIBUCAINE 1 % RE OINT: 1.0000 "application " | TOPICAL_OINTMENT | RECTAL | Status: DC | PRN

## 2013-04-16 MED ORDER — OXYTOCIN 40 UNITS IN LACTATED RINGERS INFUSION - SIMPLE MED
62.5000 mL/h | INTRAVENOUS | Status: DC
  Filled 2013-04-16: qty 1000

## 2013-04-16 MED ORDER — PHENYLEPHRINE 40 MCG/ML (10ML) SYRINGE FOR IV PUSH (FOR BLOOD PRESSURE SUPPORT)
80.0000 ug | PREFILLED_SYRINGE | INTRAVENOUS | Status: DC | PRN
  Filled 2013-04-16: qty 2

## 2013-04-16 MED ORDER — DIPHENHYDRAMINE HCL 50 MG/ML IJ SOLN: 12.5000 mg | INTRAMUSCULAR | Status: DC | PRN

## 2013-04-16 MED ORDER — PRENATAL MULTIVITAMIN CH
1.0000 | ORAL_TABLET | Freq: Every day | ORAL | Status: DC
  Administered 2013-04-17 – 2013-04-18 (×2): 1 via ORAL
  Filled 2013-04-16 (×2): qty 1

## 2013-04-16 MED ORDER — VANCOMYCIN HCL IN DEXTROSE 1-5 GM/200ML-% IV SOLN
1000.0000 mg | Freq: Two times a day (BID) | INTRAVENOUS | Status: DC
  Administered 2013-04-16: 1000 mg via INTRAVENOUS
  Filled 2013-04-16 (×2): qty 200

## 2013-04-16 MED ORDER — IBUPROFEN 600 MG PO TABS
600.0000 mg | ORAL_TABLET | Freq: Four times a day (QID) | ORAL | Status: DC
  Administered 2013-04-17 – 2013-04-18 (×7): 600 mg via ORAL
  Filled 2013-04-16 (×7): qty 1

## 2013-04-16 MED ORDER — OXYCODONE-ACETAMINOPHEN 5-325 MG PO TABS
1.0000 | ORAL_TABLET | ORAL | Status: DC | PRN
  Administered 2013-04-17: 1 via ORAL
  Filled 2013-04-16: qty 1

## 2013-04-16 MED ORDER — CITRIC ACID-SODIUM CITRATE 334-500 MG/5ML PO SOLN: 30.0000 mL | ORAL | Status: DC | PRN

## 2013-04-16 MED ORDER — SENNOSIDES-DOCUSATE SODIUM 8.6-50 MG PO TABS
2.0000 | ORAL_TABLET | ORAL | Status: DC
  Administered 2013-04-18: 2 via ORAL
  Filled 2013-04-16: qty 2

## 2013-04-16 MED ORDER — EPHEDRINE 5 MG/ML INJ
10.0000 mg | INTRAVENOUS | Status: DC | PRN
  Filled 2013-04-16: qty 4
  Filled 2013-04-16: qty 2

## 2013-04-16 MED ORDER — ONDANSETRON HCL 4 MG PO TABS: 4.0000 mg | ORAL_TABLET | ORAL | Status: DC | PRN

## 2013-04-16 MED ORDER — ONDANSETRON HCL 4 MG/2ML IJ SOLN
4.0000 mg | Freq: Four times a day (QID) | INTRAMUSCULAR | Status: DC | PRN
  Administered 2013-04-16: 4 mg via INTRAVENOUS
  Filled 2013-04-16: qty 2

## 2013-04-16 MED ORDER — OXYTOCIN 40 UNITS IN LACTATED RINGERS INFUSION - SIMPLE MED
1.0000 m[IU]/min | INTRAVENOUS | Status: DC
  Administered 2013-04-16: 2 m[IU]/min via INTRAVENOUS

## 2013-04-16 MED ORDER — LACTATED RINGERS IV SOLN: 500.0000 mL | INTRAVENOUS | Status: DC | PRN

## 2013-04-16 MED ORDER — IBUPROFEN 600 MG PO TABS
600.0000 mg | ORAL_TABLET | Freq: Four times a day (QID) | ORAL | Status: DC | PRN
  Administered 2013-04-16: 600 mg via ORAL
  Filled 2013-04-16: qty 1

## 2013-04-16 MED ORDER — LACTATED RINGERS IV SOLN: 500.0000 mL | Freq: Once | INTRAVENOUS | Status: DC

## 2013-04-16 MED ORDER — DIPHENHYDRAMINE HCL 25 MG PO CAPS: 25.0000 mg | ORAL_CAPSULE | Freq: Four times a day (QID) | ORAL | Status: DC | PRN

## 2013-04-16 MED ORDER — TETANUS-DIPHTH-ACELL PERTUSSIS 5-2.5-18.5 LF-MCG/0.5 IM SUSP: 0.5000 mL | Freq: Once | INTRAMUSCULAR | Status: DC

## 2013-04-16 MED ORDER — ACETAMINOPHEN 325 MG PO TABS: 650.0000 mg | ORAL_TABLET | ORAL | Status: DC | PRN

## 2013-04-16 MED ORDER — SIMETHICONE 80 MG PO CHEW: 80.0000 mg | CHEWABLE_TABLET | ORAL | Status: DC | PRN

## 2013-04-16 MED ORDER — ZOLPIDEM TARTRATE 5 MG PO TABS: 5.0000 mg | ORAL_TABLET | Freq: Every evening | ORAL | Status: DC | PRN

## 2013-04-16 MED ORDER — WITCH HAZEL-GLYCERIN EX PADS: 1.0000 "application " | MEDICATED_PAD | CUTANEOUS | Status: DC | PRN

## 2013-04-16 MED ORDER — FENTANYL 2.5 MCG/ML BUPIVACAINE 1/10 % EPIDURAL INFUSION (WH - ANES)
INTRAMUSCULAR | Status: DC | PRN
  Administered 2013-04-16: 14 mL/h via EPIDURAL

## 2013-04-16 MED ORDER — MISOPROSTOL 25 MCG QUARTER TABLET
25.0000 ug | ORAL_TABLET | ORAL | Status: DC | PRN
  Administered 2013-04-16: 25 ug via VAGINAL
  Filled 2013-04-16: qty 1
  Filled 2013-04-16: qty 0.25

## 2013-04-16 MED ORDER — LIDOCAINE HCL (PF) 1 % IJ SOLN
30.0000 mL | INTRAMUSCULAR | Status: DC | PRN
  Filled 2013-04-16 (×2): qty 30

## 2013-04-16 MED ORDER — ONDANSETRON HCL 4 MG/2ML IJ SOLN: 4.0000 mg | INTRAMUSCULAR | Status: DC | PRN

## 2013-04-16 MED ORDER — OXYCODONE-ACETAMINOPHEN 5-325 MG PO TABS: 1.0000 | ORAL_TABLET | ORAL | Status: DC | PRN

## 2013-04-16 NOTE — Anesthesia Preprocedure Evaluation (Signed)
Anesthesia Evaluation  Patient identified by MRN, date of birth, ID band Patient awake    Reviewed: Allergy & Precautions, H&P , Patient's Chart, lab work & pertinent test results  Airway Mallampati: III TM Distance: >3 FB Neck ROM: full    Dental no notable dental hx. (+) Teeth Intact   Pulmonary neg pulmonary ROS,  breath sounds clear to auscultation  Pulmonary exam normal       Cardiovascular hypertension, negative cardio ROS  Rhythm:regular Rate:Normal     Neuro/Psych negative neurological ROS  negative psych ROS   GI/Hepatic negative GI ROS, Neg liver ROS,   Endo/Other  negative endocrine ROSObesity  Renal/GU negative Renal ROS  negative genitourinary   Musculoskeletal   Abdominal Normal abdominal exam  (+)   Peds  Hematology negative hematology ROS (+) anemia ,   Anesthesia Other Findings   Reproductive/Obstetrics (+) Pregnancy                           Anesthesia Physical Anesthesia Plan  ASA: II  Anesthesia Plan: Epidural   Post-op Pain Management:    Induction:   Airway Management Planned:   Additional Equipment:   Intra-op Plan:   Post-operative Plan:   Informed Consent: I have reviewed the patients History and Physical, chart, labs and discussed the procedure including the risks, benefits and alternatives for the proposed anesthesia with the patient or authorized representative who has indicated his/her understanding and acceptance.     Plan Discussed with: Anesthesiologist  Anesthesia Plan Comments:         Anesthesia Quick Evaluation

## 2013-04-16 NOTE — H&P (Signed)
[name] is a 32 y.o. female G53P0101 with IUP at [redacted]w[redacted]d presenting for IOL for IUGR (<3rd%ile, 2003gm @36wk ) and with oligo (AFI of 4.2). No ctx, lof, vb. +FM.   Pt states has been doing well all pregnancy but is "always small". Last baby was IUGR and 3lb11oz at 35 weeks.   Has been getting weekly BPP/dopplers and dopplers have been with no absent or reverse flow. No other complications[facility]    Prenatal History/Complications:  Past Medical History: Past Medical History  Diagnosis Date  . Axillary abscess 2014    Left axillary abscess  . Pregnancy induced hypertension     Past Surgical History: Past Surgical History  Procedure Laterality Date  . Incise and drain abcess  2014    Obstetrical History: OB History   Grav Para Term Preterm Abortions TAB SAB Ect Mult Living   2 1  1      1      Social History: History   Social History  . Marital Status: Married    Spouse Name: N/A    Number of Children: N/A  . Years of Education: N/A   Social History Main Topics  . Smoking status: Never Smoker   . Smokeless tobacco: Never Used  . Alcohol Use: No  . Drug Use: No  . Sexual Activity: Not Currently    Birth Control/ Protection: None   Other Topics Concern  . None   Social History Narrative  . None    Family History: Family History  Problem Relation Age of Onset  . Hypertension Mother   . Hypertension Father   . Diabetes Maternal Aunt   . Diabetes Maternal Grandmother     Allergies: Allergies  Allergen Reactions  . Penicillins Hives and Nausea And Vomiting    Prescriptions prior to admission  Medication Sig Dispense Refill  . ferrous sulfate 325 (65 FE) MG tablet Take 325 mg by mouth 3 (three) times daily with meals.      . Prenatal Vit-Fe Sulfate-FA (PRENATAL VITAMIN PO) Take 1 tablet by mouth daily.         Review of Systems   Constitutional: Negative for fever, chills, weight loss, malaise/fatigue and diaphoresis.  HENT: Negative  for hearing loss, ear pain, nosebleeds, congestion, sore throat, neck pain, tinnitus and ear discharge.   Eyes: Negative for blurred vision, double vision, photophobia, pain, discharge and redness.  Respiratory: Negative for cough, hemoptysis, sputum production, shortness of breath, wheezing and stridor.   Cardiovascular: Negative for chest pain, palpitations, orthopnea,  leg swelling  Gastrointestinal: Negative for heartburn, nausea, vomiting, diarrhea, constipation, blood in stool Genitourinary: Negative for dysuria, urgency, frequency, hematuria and flank pain.  Musculoskeletal: Negative for myalgias, back pain, joint pain and falls.  Skin: Negative for itching and rash.  Neurological: Negative for dizziness, tingling, tremors, sensory change, speech change, focal weakness, seizures, loss of consciousness, weakness and headaches.  Endo/Heme/Allergies: Negative for environmental allergies and polydipsia. Does not bruise/bleed easily.  Psychiatric/Behavioral: Negative for depression, suicidal ideas, hallucinations, memory loss and substance abuse. The patient is not nervous/anxious and does not have insomnia.       Blood pressure 125/67, pulse 76, temperature 98.2 F (36.8 C), temperature source Oral, resp. rate 18, height 5\' 3"  (1.6 m), weight 79.833 kg (176 lb), last menstrual period 08/06/2012. General appearance: alert, cooperative and appears stated age Lungs: clear to auscultation bilaterally Heart: regular rate and rhythm Abdomen: soft, non-tender; bowel sounds normal Extremities: Homans sign is negative, no  sign of DVT Presentation: cephalic Fetal monitoringBaseline: 120 bpm, Variability: Good {> 6 bpm), Accelerations: Reactive and Decelerations: Absent Uterine activityNone Dilation: Fingertip Effacement (%): 20 Station: -3 Exam by:: dr Reola Calkins   Prenatal labs: ABO, Rh: O/POS/-- (04/24 1216) Antibody: NEG (09/08 0944) Rubella:   RPR: NON REAC (09/08 0944)  HBsAg: NEGATIVE  (04/24 1216)  HIV: NON REACTIVE (09/08 0944)  GBS: POSITIVE (11/03 1304)  1 hr Glucola normal Genetic screening  normal Anatomy US normal    Assessment: [name] is a 32 y.o. G2P0101 with an IUP at [redacted]w[redacted]d presenting for IOL for IUGR.   Plan: **IUGR - admit to L&D - routine orders - epidural prn - bishops unfavorable. Start with cytotec  **FWB - cat I tracing - GBS pos with SOB and rash with PCN. Cannot tolerate cephalosporins. Start vanc in active labor - EFW 5#  * anticipate SVD    Dustin Bumbaugh L, MD 04/16/2013, 10:34 AM

## 2013-04-16 NOTE — Anesthesia Procedure Notes (Signed)
Epidural Patient location during procedure: OB Start time: 04/16/2013 4:33 PM  Staffing Anesthesiologist: Roran Wegner A. Performed by: anesthesiologist   Preanesthetic Checklist Completed: patient identified, site marked, surgical consent, pre-op evaluation, timeout performed, IV checked, risks and benefits discussed and monitors and equipment checked  Epidural Patient position: sitting Prep: site prepped and draped and DuraPrep Patient monitoring: continuous pulse ox and blood pressure Approach: midline Injection technique: LOR air  Needle:  Needle type: Tuohy  Needle gauge: 17 G Needle length: 9 cm and 9 Needle insertion depth: 7 cm Catheter type: closed end flexible Catheter size: 19 Gauge Catheter at skin depth: 12 cm Test dose: negative and Other  Assessment Events: blood not aspirated, injection not painful, no injection resistance, negative IV test and no paresthesia  Additional Notes Patient identified. Risks and benefits discussed including failed block, incomplete  Pain control, post dural puncture headache, nerve damage, paralysis, blood pressure Changes, nausea, vomiting, reactions to medications-both toxic and allergic and post Partum back pain. All questions were answered. Patient expressed understanding and wished to proceed. Sterile technique was used throughout procedure. Epidural site was Dressed with sterile barrier dressing. No paresthesias, signs of intravascular injection Or signs of intrathecal spread were encountered.  Patient was more comfortable after the epidural was dosed. Please see RN's note for documentation of vital signs and FHR which are stable.

## 2013-04-17 LAB — CBC
MCH: 26.2 pg (ref 26.0–34.0)
Platelets: 208 10*3/uL (ref 150–400)
RBC: 3.44 MIL/uL — ABNORMAL LOW (ref 3.87–5.11)
WBC: 14.7 10*3/uL — ABNORMAL HIGH (ref 4.0–10.5)

## 2013-04-17 LAB — ABO/RH: ABO/RH(D): O POS

## 2013-04-17 NOTE — Progress Notes (Signed)
Post Partum Day 1 Subjective: no complaints, up ad lib, voiding, tolerating PO and + flatus Daughter at bedside, excited about baby sister Infant having some difficulty latching, desires lactation consult  Objective: Blood pressure 128/78, pulse 62, temperature 98.3 F (36.8 C), temperature source Oral, resp. rate 18, height 5\' 3"  (1.6 m), weight 79.833 kg (176 lb), last menstrual period 08/06/2012, SpO2 100.00%, unknown if currently breastfeeding.  Physical Exam:  General: alert, cooperative and no distress Lochia: appropriate Uterine Fundus: firm Incision: n/a DVT Evaluation: No evidence of DVT seen on physical exam. No significant calf/ankle edema.   Recent Labs  04/16/13 0830 04/17/13 0545  HGB 9.4* 9.0*  HCT 28.7* 27.3*    Assessment/Plan: Plan for discharge tomorrow patient delivered at approx 8pm last night Breast feeding, desires lactation consult Plans for mirena for contraception Will f/up at FT 4-6 postpartum   LOS: 1 day   Anselm Lis 04/17/2013, 7:26 AM   Evaluation and management procedures were performed by Resident physician under my supervision/collaboration. Chart reviewed, patient examined by me and I agree with management and plan. Danae Orleans, CNM 04/17/2013 9:29 AM

## 2013-04-17 NOTE — Anesthesia Postprocedure Evaluation (Signed)
Anesthesia Post Note  Patient: [name]  Procedure(s) Performed: * No procedures listed *  Anesthesia type: Epidural  Patient location: Mother/Baby  Post pain: Pain level controlled  Post assessment: Post-op Vital signs reviewed  Last Vitals:  Filed Vitals:   04/17/13 0617  BP: 128/78  Pulse: 62  Temp: 36.8 C  Resp: 18    Post vital signs: Reviewed  Level of consciousness:alert  Complications: No apparent anesthesia complications

## 2013-04-18 ENCOUNTER — Ambulatory Visit: Payer: Self-pay

## 2013-04-18 MED ORDER — IBUPROFEN 600 MG PO TABS: 600.0000 mg | ORAL_TABLET | Freq: Four times a day (QID) | ORAL | Status: DC | PRN

## 2013-04-18 NOTE — Lactation Note (Signed)
This note was copied from the chart of Monica Darnell Level. Lactation Consultation Note  Patient Name: [name] [id number] Date: 04/18/2013 Reason for consult: Follow-up assessment;Difficult latch;Late preterm infant;Infant < 6lbs.  LC had worked with this mom and baby several times today and achieved a successful latch of baby to breast using NS.  This LC attempted follow-up prior to feeding but baby has received formula feedings three times since 1600 (every 2-3 hours) and mom misunderstood recommendations from previous LC to nurse using shield and to also pump at least 4 times per 24 hours and more often if baby unable to latch for a >15 minute feeding.  Mom states she has not attempted to latch or pumped since early this afternoon.  LC discussed reason for frequent stimulation to maximize milk production and to teach baby latching and breastfeeding skills.  Mom verbalized understanding.  LC reinforced plan: attempt to latch every 2-3 hours, may pre-pump for 5 minutes but if baby does not latch for at least 15 minutes, then supplement and then pump additional 10-15 minutes with DEBP.  Milk storage reviewed based on guidelines on page 25 of Baby and Me book.   Maternal Data    Feeding Feeding Type: Bottle Fed - Formula Nipple Type: Slow - flow  LATCH Score/Interventions         Most recent LATCH=7             Lactation Tools Discussed/Used Pump Review: Milk Storage Plan: cue feeding but at least every 3 hours (offer breast, using NS as needed)          Pump with DEBP every 3 hours if not latching          Pump 4-6 times in 24 hours, if using NS and latching well  Consult Status Consult Status: Follow-up Date: 04/19/13 Follow-up type: In-patient    [name] [facility] 04/18/2013, 10:31 PM

## 2013-04-18 NOTE — Lactation Note (Signed)
This note was copied from the chart of Girl Darnell Level. Lactation Consultation Note  Patient Name: Girl Sheyann Sulton ZOXWR'U Date: 04/18/2013 Reason for consult: Follow-up assessment;Infant < 6lbs Baby asleep when I arrived for this visit. Mom is supplementing as baby is not latching consistently or for very long. Reviewed importance of baby being at the breast to help bring in milk. Mom has DEBP and is pumping but not consistently and not receiving any EBM yet. Encouraged Mom to call with the next feeding for [facility] assist with latching baby.   Maternal Data    Feeding Feeding Type: Bottle Fed - Formula Length of feed: 5 min  LATCH Score/Interventions                      Lactation Tools Discussed/Used     Consult Status Consult Status: Follow-up Date: 04/18/13 Follow-up type: In-patient    Alfred Levins 04/18/2013, 11:54 AM

## 2013-04-18 NOTE — Lactation Note (Signed)
This note was copied from the chart of [name]. Lactation Consultation Note  Patient Name: [name] [id number]'U Date: 04/18/2013 Reason for consult: Follow-up assessment;Difficult latch;Infant < 6lbs Mom called for assist with latching baby. Mom had pre-pumped. Assisted Mom with positioning and latching baby in football hold. After few attempts, baby obtained a latch but could not sustain the depth and would stop nursing. Mom reported some discomfort and compression line was visible. Applied #20 nipple shield, demonstrated how to pre-load with EBM or formula to stimulate baby to suckle. Baby developed a good suckling pattern and BF for 15 more minutes for a total of 20 minutes at the breast. Some colostrum was visible in the nipple shield at the end of the feeding. Mom denied discomfort using nipple shield. Plan discussed with parents: BF whenever baby is hungry but at least every 3 hours, Pre-pump for 5 minutes to get the flow of milk moving. BF using nipple shield for 15-30 minutes, then FOB to give supplement according to guidelines per hours of age while Mom post pump for 15 minutes on preemie setting. Mom to alternate each feeding the breast she breastfeeds on. Parents reported this to be a workable plan at this time. Encouraged to ask for assist as needed.   Maternal Data    Feeding Feeding Type: Formula Nipple Type: Slow - flow Length of feed: 20 min  LATCH Score/Interventions Latch: Repeated attempts needed to sustain latch, nipple held in mouth throughout feeding, stimulation needed to elicit sucking reflex. (initiated #20 nipple shield) Intervention(s): Adjust position;Assist with latch;Breast massage;Breast compression  Audible Swallowing: A few with stimulation  Type of Nipple: Everted at rest and after stimulation (short nipple shaft)  Comfort (Breast/Nipple): Soft / non-tender     Hold (Positioning): Assistance needed to correctly position infant at breast  and maintain latch. Intervention(s): Breastfeeding basics reviewed;Support Pillows;Position options;Skin to skin  LATCH Score: 7  Lactation Tools Discussed/Used Tools: Nipple Dorris Carnes;Pump Nipple shield size: 20 Breast pump type: Double-Electric Breast Pump   Consult Status Consult Status: Follow-up Date: 04/19/13 Follow-up type: In-patient    Alfred Levins 04/18/2013, 2:21 PM

## 2013-04-18 NOTE — Lactation Note (Signed)
This note was copied from the chart of [name]. Lactation Consultation Note  Patient Name: [name] [id number] Date: 04/18/2013 Reason for consult: Follow-up assessment;Infant < 6lbs Mom did not call with the last feeding as encouraged. She did not attempt to latch baby, gave supplement of formula via bottle. Asked Mom if she wants to latch baby or pump and bottle feed. Mom is not sure at this point. Mom has single breast pump at home. Advised to call insurance company about DEBP. Mom is not registered with WIC. Advised Mom if she decides to pump and bottle feed, she needs to pump every 3 hours to encourage milk production and protect milk supply. Advised if she wants baby at the breast, we need to be breastfeeding every feeding. Also encouraged Mom to pump every 3 hours with BF as well. Encouraged Mom to call [facility] for assist with latch at next feeding if she wants baby at the breast.   Maternal Data    Feeding Feeding Type: Bottle Fed - Formula Length of feed: 5 min  LATCH Score/Interventions                      Lactation Tools Discussed/Used     Consult Status Consult Status: Follow-up Date: 04/18/13 Follow-up type: In-patient    Alfred Levins 04/18/2013, 11:56 AM

## 2013-04-18 NOTE — Discharge Summary (Signed)
Obstetric Discharge Summary Reason for Admission: induction of labor due to IUGR (<3rd%tile, 2003 g @36wk ) with oligo (AFI 4.2) Prenatal Procedures: ultrasound Intrapartum Procedures: spontaneous vaginal delivery Postpartum Procedures: none Complications-Operative and Postpartum: none Hemoglobin  Date Value Range Status  04/17/2013 9.0* 12.0 - 15.0 g/dL Final     HCT  Date Value Range Status  04/17/2013 27.3* 36.0 - 46.0 % Final    Physical Exam:  General: alert, cooperative, appears stated age and no distress Lochia: appropriate Uterine Fundus: firm Incision: N/A DVT Evaluation: No evidence of DVT seen on physical exam. Negative Homan's sign. No cords or calf tenderness. No significant calf/ankle edema. Cardiovascular: S1/S2 distinct, RRR; no murmurs, rubs or heaves  Lungs: CTAB  Discharge Diagnoses: Term Pregnancy-delivered  Discharge Information: Date: 04/18/2013 Activity: unrestricted Diet: routine, iron-rich foods Medications: Ibuprofen, Iron, PNV Condition: stable Instructions: refer to practice specific booklet and f/u in 4-6 weeks for post-partum f/u Discharge to: home   Newborn Data: Live born female  Birth Weight: 4 lb 12 oz (2155 g) APGAR: 8, 9  Home with mother  Discharge Summary:  [name] is a 13 yp G2P1102 who presented at [redacted]w[redacted]d for IOL due to IUGF (<3%, 2003g @36wks ) and oligohydraminos (AFI of 4.2).  Pt had h/o IUGR with previous pregnancy, and had been getting weekly BPP/dopplers.  No other complications occurred during this pregnancy.  She ws GBS (+) with a PCN allergy; per cultures, she was started on 1g IV vancomycin Q12H at 1500 on 11/10 until delivery at 2244. Her labor progressed appropriately and she delivered a viable baby girl via SVD at 8:07PM on 11/10.  She had no post-partum complications or lacerations. EBL 250 mL.  Today, ms. [name] reports feeling better than yesterday.  She is still experiencing some moderate pain, 4/10, but it is  controlled well with Motrin 600mg  Q6H.  She states that her bleeding is more like a normal period, and denies passage of any large clots.  She is ambulatory per usual s/p epidural, and reports full strength and sensation of her LEs. She has been urinating and passing flatus per usual, and she has had a BM.  Her appetite has returned and is normal for her.  She denies any HA, CP, SOB, N/V, diarrhea, fever or chills. She is currently breast feeding, but started supplementation with formula until her milk comes in.  She is working with lactation and has no concerns at this time.  She plans on using Mirena for contraception.  She will f/u at the clinic in 4-6 weeks for her post-partum check up, and baby has established pediatric care.   Christiana Fuchs 04/18/2013, 7:20 AM  I was present for the exam and agree with above.  [place], CNM 04/18/2013 9:27 AM

## 2013-04-19 ENCOUNTER — Ambulatory Visit: Payer: Self-pay

## 2013-04-19 NOTE — Lactation Note (Signed)
This note was copied from the chart of Monica Darnell Level. Lactation Consultation Note: Follow up visit with mom before DC with baby under 6 lbs. Mom has been giving bottles of formula through the night. Reports that baby latches well with nipple shield but only nurses for 5 minutes. Reports that she pumped 4 times yesterday and is obtaining small amounts of Colostrum. Reports that her husband is trying to get a DEBP from insurance.Offered pump rental until she is able to get pump but mom wants to wait and see what he can get. Offered OP appointment with [place] but mom refused- she states she will call if she needs one. No questions at present. Reviewed frequent feedings or pumping to promote a good milk supply. Baby had formula about 1/2 hour ago and is asleep in bassinet at this time. Encouraged to page for assist before DC if she needs help with breast feeding.   Patient Name: [name] [id number]'[id number] Date: 04/19/2013 Reason for consult: Follow-up assessment   Maternal Data    Feeding Feeding Type: Bottle Fed - Formula  LATCH Score/Interventions                      Lactation Tools Discussed/Used     Consult Status Consult Status: Complete    Pamelia Hoit 04/19/2013, 8:17 AM

## 2013-04-23 ENCOUNTER — Inpatient Hospital Stay (HOSPITAL_COMMUNITY)
Admission: AD | Admit: 2013-04-23 | Discharge: 2013-04-23 | Disposition: A | Discharge status: Home / Self Care | Payer: BC Managed Care – PPO | Source: Ambulatory Visit | Attending: Family Medicine | Admitting: Family Medicine

## 2013-04-23 ENCOUNTER — Encounter (HOSPITAL_COMMUNITY): Payer: Self-pay

## 2013-04-23 DIAGNOSIS — R05 Cough: Secondary | ICD-10-CM | POA: Insufficient documentation

## 2013-04-23 DIAGNOSIS — R059 Cough, unspecified: Secondary | ICD-10-CM | POA: Insufficient documentation

## 2013-04-23 DIAGNOSIS — O99891 Other specified diseases and conditions complicating pregnancy: Secondary | ICD-10-CM | POA: Insufficient documentation

## 2013-04-23 DIAGNOSIS — R0989 Other specified symptoms and signs involving the circulatory and respiratory systems: Secondary | ICD-10-CM | POA: Insufficient documentation

## 2013-04-23 DIAGNOSIS — J069 Acute upper respiratory infection, unspecified: Secondary | ICD-10-CM

## 2013-04-23 NOTE — MAU Provider Note (Signed)
Chart reviewed and agree with management and plan.  

## 2013-04-23 NOTE — MAU Note (Signed)
PATIENT IS IN WITH C/O PRODUCTIVE COUGH (GREEN SPUTUM) AND HEADACHE WHEN SHE COUGHS. SHE HAD AN UNCOMPLICATED VAGINAL DELIVERY LAST WEEK Tuesday. SHE DENIES ANY OTHER COMPLAINS. SHE IS BREASTFEEDING AND WAS NOT SURE WHAT OTC MEDICINE SHE CAN TAKE.

## 2013-04-23 NOTE — MAU Provider Note (Signed)
History of Present Illness   Patient Identification [name] is a 32 y.o. female.  Patient information was obtained from patient. History/Exam limitations: none. Patient presented to the MAU by private vehicle.  Chief Complaint  URI   Patient presents for evaluation of congestion, cough. This started Thursday when she was discharged after giving birth one week ago. She denies fever or productive cough. She denies sick contacts. She has taken nothing for this because she is breastfeeding and is unsure of what safe options are available for her.   Past Medical History  Diagnosis Date  . Axillary abscess 2014    Left axillary abscess  . Pregnancy induced hypertension    Family History  Problem Relation Age of Onset  . Hypertension Mother   . Hypertension Father   . Diabetes Maternal Aunt   . Diabetes Maternal Grandmother    No current facility-administered medications for this encounter.   Allergies  Allergen Reactions  . Penicillins Hives and Nausea And Vomiting   History   Social History  . Marital Status: Married    Spouse Name: N/A    Number of Children: N/A  . Years of Education: N/A   Occupational History  . Not on file.   Social History Main Topics  . Smoking status: Never Smoker   . Smokeless tobacco: Never Used  . Alcohol Use: No  . Drug Use: No  . Sexual Activity: Not Currently    Birth Control/ Protection: None   Other Topics Concern  . Not on file   Social History Narrative  . No narrative on file   Review of Systems A comprehensive review of systems was negative.   Physical Exam   BP 134/71  Pulse 92  Temp(Src) 99.7 F (37.6 C) (Oral)  Resp 16  SpO2 98%  LMP 08/06/2012 BP 134/71  Pulse 92  Temp(Src) 99.7 F (37.6 C) (Oral)  Resp 16  SpO2 98%  LMP 08/06/2012  General Appearance:    Alert, cooperative, no distress, appears stated age  Eyes:    PERRL, conjunctiva/corneas clear, EOM's intact, fundi    benign, both eyes   Ears:    Normal TM's and external ear canals, both ears  Nose:   Nares normal, septum midline, mucosa normal, no drainage    or sinus tenderness  Throat:   Lips, mucosa, and tongue normal; teeth and gums normal  Back:     Symmetric, no curvature, ROM normal, no CVA tenderness  Lungs:     Clear to auscultation bilaterally, respirations unlabored   Heart:    Regular rate and rhythm, S1 and S2 normal, no murmur, rub   or gallop  Abdomen:     Soft, non-tender, bowel sounds active all four quadrants,    no masses, no organomegaly  Extremities:   Extremities normal, atraumatic, no cyanosis or edema  Skin:   Skin color, texture, turgor normal, no rashes or lesions  Lymph nodes:   Cervical, supraclavicular, and axillary nodes normal  Neurologic:   CNII-XII intact, normal strength, sensation and reflexes    throughout    A/P: Discharge home with advice on safe medications while breastfeeding. Handout given. Follow up if no symptom resolution in 7 days.   Hazeline Junker, MD  I have seen and examined this patient and agree with above documentation in the resident's note. Discussed medications safe to use with lactation. F/u as scheduled for postpartum visit.   Rulon Abide, M.D. [facility] Fellow 04/23/2013 5:49 PM

## 2013-04-23 NOTE — MAU Note (Signed)
Patient states she had a vaginal delivery on 11-10. States she has had chest congestion and cough, no fever, since 11-14.

## 2013-04-25 ENCOUNTER — Ambulatory Visit (INDEPENDENT_AMBULATORY_CARE_PROVIDER_SITE_OTHER): Payer: BC Managed Care – PPO | Admitting: Family Medicine

## 2013-04-25 ENCOUNTER — Telehealth: Payer: Self-pay | Admitting: Obstetrics and Gynecology

## 2013-04-25 ENCOUNTER — Encounter: Payer: Self-pay | Admitting: Family Medicine

## 2013-04-25 VITALS — BP 158/88 | HR 103 | Resp 16 | Wt 166.4 lb

## 2013-04-25 DIAGNOSIS — J0100 Acute maxillary sinusitis, unspecified: Secondary | ICD-10-CM

## 2013-04-25 DIAGNOSIS — B9689 Other specified bacterial agents as the cause of diseases classified elsewhere: Secondary | ICD-10-CM

## 2013-04-25 DIAGNOSIS — J209 Acute bronchitis, unspecified: Secondary | ICD-10-CM

## 2013-04-25 DIAGNOSIS — D649 Anemia, unspecified: Secondary | ICD-10-CM

## 2013-04-25 DIAGNOSIS — E559 Vitamin D deficiency, unspecified: Secondary | ICD-10-CM

## 2013-04-25 DIAGNOSIS — A499 Bacterial infection, unspecified: Secondary | ICD-10-CM

## 2013-04-25 MED ORDER — ERYTHROMYCIN BASE 500 MG PO TABS: 500.0000 mg | ORAL_TABLET | Freq: Two times a day (BID) | ORAL | Status: AC

## 2013-04-25 NOTE — Progress Notes (Signed)
  Subjective:    Patient ID: [name], female    DOB: 06-Jan-1981, 32 y.o.   MRN: [id number]  HPI 1 week h/o progressively worsening head and chest congestion with intermittent chills , sputum and nasal drainage are green. Pt is week and fatigued due to excessive cough and poor sleep, has been using OTC medication. Has infant girl less than 61 weeks old, and is currently pumping breast milk   Review of Systems See HPI . Denies palpitations and leg swelling Denies abdominal pain, nausea, vomiting,diarrhea or constipation.   Denies dysuria, frequency, hesitancy or incontinence. Denies joint pain, swelling and limitation in mobility. Denies headaches, seizures, numbness, or tingling. Denies depression, anxiety or insomnia. Denies skin break down or rash.        Objective:   Physical Exam  Patient alert and oriented and in no cardiopulmonary distress.  HEENT: No facial asymmetry, EOMI, maxillary  sinus tenderness,  oropharynx pink and moist.  Neck supple no adenopathy.  Chest: decreased though adequate air entry, few crackles, no wheezes  CVS: S1, S2 no murmurs, no S3.  ABD: Soft non tender. Bowel sounds normal.  Ext: No edema        Assessment & Plan:

## 2013-04-25 NOTE — Patient Instructions (Signed)
F/u in 3 month, call if you need me before  Pls call back in about  2 weeks , you NEED the flu vaccine  CONGRATS on new addition to the family and all the best!  You are being treated for acute bacterial bronchitis and sinusitis, eryhtromycin is prescribed for 10 days, this is safe with breast feeding  Eat yogurt daily to help to reduce the risk of yeast infection. If you get this  Use OTC topical monistat if you absolutely need to  Hand washing and care not to transmit respiratory secretions, like cough or sneeze onto baby will help to protect her.  Check with the pediatrician re  Breast milk , I think  This is OK  espescialy since you are pumping the milk  CBC, iron , and ferritin in 3 month

## 2013-04-29 DIAGNOSIS — J019 Acute sinusitis, unspecified: Secondary | ICD-10-CM | POA: Insufficient documentation

## 2013-04-29 DIAGNOSIS — D509 Iron deficiency anemia, unspecified: Secondary | ICD-10-CM | POA: Insufficient documentation

## 2013-04-29 NOTE — Assessment & Plan Note (Signed)
eryhtromycin prescribed. Pt to call back if no improvementin 3 to 5 days

## 2013-04-29 NOTE — Assessment & Plan Note (Signed)
10 day course of antibiotic prescribed 

## 2013-04-29 NOTE — Assessment & Plan Note (Signed)
rept lab in 3 month at f/u

## 2013-05-21 ENCOUNTER — Encounter: Payer: Self-pay | Admitting: Obstetrics & Gynecology

## 2013-05-21 ENCOUNTER — Ambulatory Visit (INDEPENDENT_AMBULATORY_CARE_PROVIDER_SITE_OTHER): Payer: BC Managed Care – PPO | Admitting: Obstetrics & Gynecology

## 2013-05-21 NOTE — Progress Notes (Signed)
Patient ID: Jerlyn Ly, female   DOB: January 29, 1981, 32 y.o.   MRN: [id number] See delivery note below  No complaints Doing well  no sutures  Exam Normal involution midplane Cervix parous  Pt wants mirena iud placed No sex yet, will refrin  Schedule asap for placement  Past Medical History  Diagnosis Date  . Axillary abscess 2014    Left axillary abscess  . Pregnancy induced hypertension     Past Surgical History  Procedure Laterality Date  . Incise and drain abcess  2014    OB History   Grav Para Term Preterm Abortions TAB SAB Ect Mult Living   2 2 1 1      2       Allergies  Allergen Reactions  . Penicillins Hives and Nausea And Vomiting    History   Social History  . Marital Status: Married    Spouse Name: N/A    Number of Children: N/A  . Years of Education: N/A   Social History Main Topics  . Smoking status: Never Smoker   . Smokeless tobacco: Never Used  . Alcohol Use: No  . Drug Use: No  . Sexual Activity: Not Currently    Birth Control/ Protection: None   Other Topics Concern  . None   Social History Narrative  . None    Family History  Problem Relation Age of Onset  . Hypertension Mother   . Hypertension Father   . Diabetes Maternal Aunt   . Diabetes Maternal Grandmother     L&D Delivery Note by Vale Haven, MD at 04/16/2013 8:47 PM    Author: Vale Haven, MD Service: Obstetrics Author Type: Physician   Filed: 04/16/2013 8:55 PM Note Time: 04/16/2013 8:47 PM Status: Signed   Editor: Vale Haven, MD (Physician)     Related Notes: Original Note by Hal Neer, MD (Resident) filed at 04/16/2013 8:50 PM    Delivery Note  At 8:07 PM a viable female was delivered via Vaginal, Spontaneous Delivery (Presentation: ; Right Occiput Anterior). APGAR: 8, 9; weight: pending  Placenta status: Intact, Spontaneous. Cord: 3 vessels with the following complications: None.  Anesthesia: Epidural  Episiotomy: None  Lacerations: None  Suture  Repair: n/a  Est. Blood Loss (mL): 250  Mother pushed for 10 mins to deliver viable female infant over intact perineum. Tight nuchal cord x 1 which was delivered through and reduced after delivery of the body. Shoulders and body delivered without difficulty or delay. Baby crying upon delivery and placed on maternal abdomen. Cord clamping delayed until umbilical cord stopped pulsing. Cord then clamped and cut by FOB. Third stage of labor actively managed with gentle cord traction and Oxytocin. Placenta delivered intact with 3 vessel cord. No perineal or vaginal tears. Mother and infant stable at conclusion of delivery. Counts correct and verified. Mom to postpartum. Infant to couplet care/skin to skin.  Delivery performed by me with direct supervision by Dr. Reola Calkins

## 2013-05-23 ENCOUNTER — Ambulatory Visit (INDEPENDENT_AMBULATORY_CARE_PROVIDER_SITE_OTHER): Payer: BC Managed Care – PPO | Admitting: Advanced Practice Midwife

## 2013-05-23 ENCOUNTER — Encounter: Payer: Self-pay | Admitting: Advanced Practice Midwife

## 2013-05-23 VITALS — BP 150/98 | Ht 63.0 in | Wt 157.0 lb

## 2013-05-23 DIAGNOSIS — Z3202 Encounter for pregnancy test, result negative: Secondary | ICD-10-CM

## 2013-05-23 DIAGNOSIS — Z30430 Encounter for insertion of intrauterine contraceptive device: Secondary | ICD-10-CM

## 2013-05-23 LAB — POCT URINE PREGNANCY: Preg Test, Ur: NEGATIVE

## 2013-05-23 NOTE — Progress Notes (Signed)
[name] is a 32 y.o. year old [place] American female  who presents for placement of a Mirena IUD. Her LMP was (postpartum and breastfeeding) and her pregnancy test today is negative.  The risks and benefits of the method and placement have been thouroughly reviewed with the patient and all questions were answered.  Specifically the patient is aware of failure rate of 06/998, expulsion of the IUD and of possible perforation.  The patient is aware of irregular bleeding due to the method and understands the incidence of irregular bleeding diminishes with time.  Time out was performed.  A Graves speculum was placed.  The cervix was prepped using Betadine. The uterus was found to be retroflexed and it sounded to 7 cm.  The cervix was grasped with a tenaculum and the IUD was inserted to 7 cm.  It was pulled back 1 cm and the IUD was disengaged.  The strings were trimmed to 3 cm.  Sonogram was performed and the proper placement of the IUD was verified.  The patient was instructed on signs and symptoms of infection and to check for the strings after each menses or each month.  The patient is to refrain from intercourse for 3 days.  The patient is scheduled for a return appointment after her first menses or 4 weeks.  CRESENZO-DISHMAN,[name] 05/23/2013 3:33 PM

## 2013-06-12 ENCOUNTER — Encounter: Payer: Self-pay | Admitting: Advanced Practice Midwife

## 2013-06-12 ENCOUNTER — Ambulatory Visit (INDEPENDENT_AMBULATORY_CARE_PROVIDER_SITE_OTHER): Payer: BC Managed Care – PPO | Admitting: Advanced Practice Midwife

## 2013-06-12 VITALS — BP 158/92 | Ht 63.0 in | Wt 158.0 lb

## 2013-06-12 DIAGNOSIS — Z30432 Encounter for removal of intrauterine contraceptive device: Secondary | ICD-10-CM

## 2013-06-12 MED ORDER — NORETHINDRONE 0.35 MG PO TABS: 1.0000 | ORAL_TABLET | Freq: Every day | ORAL | Status: DC

## 2013-06-12 NOTE — Progress Notes (Signed)
[name] 33 y.o. Had Mirena place 3 weeks ago.  About a week ago, started having pain in her uterus when she bends over, and during intercourse.  Is also having vaginal bleeding.  US reveals IUD now in the lower uterine segment near cervix.  Options discussed with pt (leaving in vs taking out) and she opts for removal. Her plans for future contraception are POP's.  A graves speculum was placed, and the strings were visible.  They were grasped with a curved Claiborne Billings and the IUD easily removed.  Pt given IUD removal f/u instructions

## 2013-06-20 ENCOUNTER — Ambulatory Visit: Payer: BC Managed Care – PPO | Admitting: Advanced Practice Midwife

## 2013-10-10 ENCOUNTER — Emergency Department (HOSPITAL_COMMUNITY)
Admission: EM | Admit: 2013-10-10 | Discharge: 2013-10-10 | Disposition: A | Discharge status: Home / Self Care | Payer: BC Managed Care – PPO | Source: Home / Self Care

## 2013-10-10 ENCOUNTER — Encounter (HOSPITAL_COMMUNITY): Payer: Self-pay | Admitting: Emergency Medicine

## 2013-10-10 DIAGNOSIS — R519 Headache, unspecified: Secondary | ICD-10-CM

## 2013-10-10 DIAGNOSIS — R51 Headache: Secondary | ICD-10-CM

## 2013-10-10 MED ORDER — KETOROLAC TROMETHAMINE 60 MG/2ML IM SOLN
INTRAMUSCULAR | Status: AC
  Filled 2013-10-10: qty 2

## 2013-10-10 MED ORDER — DEXAMETHASONE SODIUM PHOSPHATE 10 MG/ML IJ SOLN
INTRAMUSCULAR | Status: AC
  Filled 2013-10-10: qty 1

## 2013-10-10 MED ORDER — DEXAMETHASONE SODIUM PHOSPHATE 10 MG/ML IJ SOLN
5.0000 mg | Freq: Once | INTRAMUSCULAR | Status: AC
  Administered 2013-10-10: 5 mg via INTRAMUSCULAR

## 2013-10-10 MED ORDER — KETOROLAC TROMETHAMINE 60 MG/2ML IM SOLN: 60.0000 mg | Freq: Once | INTRAMUSCULAR | Status: DC

## 2013-10-10 NOTE — Discharge Instructions (Signed)

## 2013-10-10 NOTE — ED Provider Notes (Signed)
CSN: [id number]     Arrival date & time 10/10/13  1437 History   First MD Initiated Contact with Patient 10/10/13 1512     Chief Complaint  Patient presents with  . Headache   (Consider location/radiation/quality/duration/timing/severity/associated sxs/prior Treatment) HPI Comments: 33 year old female is complaining of a headache for 2 weeks. The headache is located across the frontal aspect as well as occasional sharp pains on the sides. The headaches usually develop in the late morning or around name and occur off and on until bedtime. There is no throbbing. Denies problems with vision, speech, hearing, swallowing, nausea, vomiting, photophobia or other signs of migrainous headache. No focal paresthesia or motor weakness, no incontinence.   Past Medical History  Diagnosis Date  . Axillary abscess 2014    Left axillary abscess  . Pregnancy induced hypertension    Past Surgical History  Procedure Laterality Date  . Incise and drain abcess  2014   Family History  Problem Relation Age of Onset  . Hypertension Mother   . Hypertension Father   . Diabetes Maternal Aunt   . Diabetes Maternal Grandmother    History  Substance Use Topics  . Smoking status: Never Smoker   . Smokeless tobacco: Never Used  . Alcohol Use: No   OB History   Grav Para Term Preterm Abortions TAB SAB Ect Mult Living   2 2 1 1      2      Review of Systems  Constitutional: Negative.   HENT: Negative for congestion, postnasal drip, sinus pressure and sore throat.   Eyes: Negative for pain and visual disturbance.  Respiratory: Negative for cough and shortness of breath.   Cardiovascular: Negative for chest pain and palpitations.  Gastrointestinal: Negative for nausea, vomiting and abdominal pain.  Genitourinary: Negative.   Musculoskeletal: Negative for myalgias, neck pain and neck stiffness.  Skin: Negative.   Neurological: Positive for headaches. Negative for dizziness, tremors, seizures, syncope,  facial asymmetry, speech difficulty, weakness, light-headedness and numbness.  Psychiatric/Behavioral: Negative.     Allergies  Penicillins  Home Medications   Prior to Admission medications   Medication Sig Start Date End Date Taking? Authorizing Provider  ferrous sulfate 325 (65 FE) MG tablet Take 325 mg by mouth 3 (three) times daily with meals.    Historical Provider, MD  ibuprofen (ADVIL,MOTRIN) 600 MG tablet Take 1 tablet (600 mg total) by mouth every 6 (six) hours as needed. 04/18/13   Manya Silvas, CNM  levonorgestrel (MIRENA) 20 MCG/24HR IUD 1 each by Intrauterine route once.    Historical Provider, MD  norethindrone (MICRONOR,CAMILA,ERRIN) 0.35 MG tablet Take 1 tablet (0.35 mg total) by mouth daily. 06/12/13   Christin Fudge, CNM  Prenatal Vit-Fe Sulfate-FA (PRENATAL VITAMIN PO) Take 1 tablet by mouth daily.    Historical Provider, MD   BP 152/85  Pulse 98  Temp(Src) 98.8 F (37.1 C) (Oral)  Resp 16  SpO2 100%  LMP 10/08/2013  Breastfeeding? No Physical Exam  Nursing note and vitals reviewed. Constitutional: She is oriented to person, place, and time. She appears well-developed and well-nourished. No distress.  HENT:  Head: Normocephalic and atraumatic.  Right Ear: External ear normal.  Left Ear: External ear normal.  Mouth/Throat: Oropharynx is clear and moist. No oropharyngeal exudate.  Soft palate rises symmetrically  Eyes: Conjunctivae and EOM are normal. Pupils are equal, round, and reactive to light.  Neck: Normal range of motion. Neck supple.  Cardiovascular: Normal rate, regular rhythm, normal heart sounds and intact distal  pulses.   No murmur heard. Pulmonary/Chest: Effort normal and breath sounds normal. No respiratory distress. She has no wheezes. She has no rales.  Abdominal: Soft. There is no tenderness.  Musculoskeletal: Normal range of motion. She exhibits no edema and no tenderness.  Lymphadenopathy:    She has no cervical adenopathy.   Neurological: She is alert and oriented to person, place, and time. She has normal strength. She displays no tremor. No cranial nerve deficit or sensory deficit. She exhibits normal muscle tone. She displays a negative Romberg sign. She displays no seizure activity. Coordination and gait normal. GCS eye subscore is 4. GCS verbal subscore is 5. GCS motor subscore is 6.  Reflex Scores:      Patellar reflexes are 1+ on the right side and 1+ on the left side. No pass pointing Nl heel to toe.  Skin: Skin is warm and dry. No rash noted.  Psychiatric: She has a normal mood and affect.    ED Course  Procedures (including critical care time) Labs Review Labs Reviewed - No data to display  Imaging Review No results found.   MDM   1. Headache in front of head    More at tension type. No focal or other abnormal neurologic sx's or findings.  Toradol 60 mg and Decadron 5 mg IM...........................the patient declined Toradol injection because it was TBA in the glut medius. Aleve at home prn.  Get appt with PCP     Janne Napoleon, NP 10/10/13 1542

## 2013-10-10 NOTE — ED Notes (Signed)
Pt declined toradol injection.  Mw,cma

## 2013-10-10 NOTE — ED Provider Notes (Signed)
Medical screening examination/treatment/procedure(s) were performed by resident physician or non-physician practitioner and as supervising physician I was immediately available for consultation/collaboration.   Pauline Good MD.   Billy Fischer, MD 10/10/13 2142

## 2013-10-10 NOTE — ED Notes (Signed)
C/o headaches x 2 wks.  No relief with otc meds.  Denies n/v.   Denies hx of migraines.  Pt states "blood pressure has been up and down since giving birth in November.  Denies hx of hypertension.

## 2013-10-11 ENCOUNTER — Telehealth: Payer: Self-pay

## 2013-10-11 NOTE — Telephone Encounter (Signed)
Soonest available is next Tuesday pls sched for then

## 2013-10-11 NOTE — Telephone Encounter (Signed)
Patient notified and scheduled 

## 2013-10-16 ENCOUNTER — Ambulatory Visit (INDEPENDENT_AMBULATORY_CARE_PROVIDER_SITE_OTHER): Payer: BC Managed Care – PPO | Admitting: Family Medicine

## 2013-10-16 ENCOUNTER — Encounter: Payer: Self-pay | Admitting: Family Medicine

## 2013-10-16 VITALS — BP 150/100 | HR 69 | Resp 16 | Wt 151.4 lb

## 2013-10-16 DIAGNOSIS — F419 Anxiety disorder, unspecified: Secondary | ICD-10-CM

## 2013-10-16 DIAGNOSIS — R51 Headache: Secondary | ICD-10-CM

## 2013-10-16 DIAGNOSIS — Z13220 Encounter for screening for lipoid disorders: Secondary | ICD-10-CM

## 2013-10-16 DIAGNOSIS — I10 Essential (primary) hypertension: Secondary | ICD-10-CM

## 2013-10-16 DIAGNOSIS — F411 Generalized anxiety disorder: Secondary | ICD-10-CM

## 2013-10-16 DIAGNOSIS — R519 Headache, unspecified: Secondary | ICD-10-CM | POA: Insufficient documentation

## 2013-10-16 LAB — LIPID PANEL
Cholesterol: 207 mg/dL — ABNORMAL HIGH (ref 0–200)
HDL: 48 mg/dL (ref >39)
LDL CALC: 135 mg/dL — AB (ref 0–99)
TRIGLYCERIDES: 122 mg/dL (ref <150)
Total CHOL/HDL Ratio: 4.3 Ratio
VLDL: 24 mg/dL (ref 0–40)

## 2013-10-16 LAB — BASIC METABOLIC PANEL
BUN: 11 mg/dL (ref 6–23)
CALCIUM: 9.6 mg/dL (ref 8.4–10.5)
CO2: 26 mEq/L (ref 19–32)
CREATININE: 0.67 mg/dL (ref 0.50–1.10)
Chloride: 105 mEq/L (ref 96–112)
Glucose, Bld: 81 mg/dL (ref 70–99)
Potassium: 3.7 mEq/L (ref 3.5–5.3)
Sodium: 136 mEq/L (ref 135–145)

## 2013-10-16 LAB — CBC WITH DIFFERENTIAL/PLATELET
Basophils Absolute: 0 10*3/uL (ref 0.0–0.1)
Basophils Relative: 0 % (ref 0–1)
Eosinophils Absolute: 0.1 10*3/uL (ref 0.0–0.7)
Eosinophils Relative: 1 % (ref 0–5)
HEMATOCRIT: 36.2 % (ref 36.0–46.0)
Hemoglobin: 12 g/dL (ref 12.0–15.0)
LYMPHS PCT: 34 % (ref 12–46)
Lymphs Abs: 2.3 10*3/uL (ref 0.7–4.0)
MCH: 26.5 pg (ref 26.0–34.0)
MCHC: 33.1 g/dL (ref 30.0–36.0)
MCV: 79.9 fL (ref 78.0–100.0)
MONO ABS: 0.4 10*3/uL (ref 0.1–1.0)
MONOS PCT: 6 % (ref 3–12)
Neutro Abs: 4.1 10*3/uL (ref 1.7–7.7)
Neutrophils Relative %: 59 % (ref 43–77)
Platelets: 310 10*3/uL (ref 150–400)
RBC: 4.53 MIL/uL (ref 3.87–5.11)
RDW: 13.9 % (ref 11.5–15.5)
WBC: 6.9 10*3/uL (ref 4.0–10.5)

## 2013-10-16 MED ORDER — AMLODIPINE BESYLATE 5 MG PO TABS: 5.0000 mg | ORAL_TABLET | Freq: Every day | ORAL | Status: DC

## 2013-10-16 NOTE — Patient Instructions (Signed)
F/u in 4 Weeks, call if you need me before  BP is high, medication is sent in take at same time daily.  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

## 2013-10-16 NOTE — Assessment & Plan Note (Signed)
New daily headache likely due to stress and anxiety. Neuro exam is normal. MRI ordered based on pt's anxiety esp in light of recent unfortunate death of her husband's cousin from brain tumor

## 2013-10-16 NOTE — Assessment & Plan Note (Signed)
DASH diet and commitment to daily physical activity for a minimum of 30 minutes discussed and encouraged, as a part of hypertension management. The importance of attaining a healthy weight is also discussed. Start amlodipine

## 2013-10-16 NOTE — Assessment & Plan Note (Signed)
Increased  Anxiety due to recently starting work, pt encouraged to verbalize her feelings

## 2013-10-16 NOTE — Progress Notes (Signed)
   Subjective:    Patient ID: [name], female    DOB: 24-Jun-1980, 33 y.o.   MRN: [id number]  HPI  3 week h/o daily frontal tightness from a 2 to a 3, no change in vision numbmness or weakness. Her spouse's 7 y/o cousin recently died due to undiscovered head tumor was buried this past weekend, this has only contributed to worsening symptoms and anxiety as to whether this is her situation. 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 Denies sinus pressure, nasal congestion, ear pain or sore throat. Denies chest congestion, productive cough or wheezing. Denies chest pains, palpitations and leg swelling Denies abdominal pain, nausea, vomiting,diarrhea or constipation.   Denies dysuria, frequency, hesitancy or incontinence. Denies joint pain, swelling and limitation in mobility. Denies  seizures, numbness, or tingling. Denies depression,or insomnia. Denies skin break down or rash.        Objective:   Physical Exam BP 150/100  Pulse 69  Resp 16  Wt 151 lb 6.4 oz (68.675 kg)  SpO2 99%  LMP 10/08/2013 Patient alert and oriented and in no cardiopulmonary distress.  HEENT: No facial asymmetry, EOMI, no sinus tenderness,  oropharynx pink and moist.  Neck supple no adenopathy.  Chest: Clear to auscultation bilaterally.  CVS: S1, S2 no murmurs, no S3.  ABD: Soft non tender. Bowel sounds normal.  Ext: No edema  MS: Adequate ROM spine, shoulders, hips and knees.  Skin: Intact, no ulcerations or rash noted.  Psych: Good eye contact, normal affect. Memory intact not anxious or depressed appearing.  CNS: CN 2-12 intact, power, tone and sensation  normal throughout.        Assessment & Plan:  Headache(784.0) New daily headache likely due to stress and anxiety. Neuro exam is normal. MRI ordered based on pt's anxiety esp in light of recent unfortunate death of her husband's cousin from brain tumor  HTN (hypertension) DASH diet and commitment to daily physical activity for a minimum of 30 minutes discussed and encouraged, as a part of hypertension management. The importance of attaining a healthy weight is also discussed. Start amlodipine  Anxiety Increased  Anxiety due to recently starting work, pt encouraged to verbalize her feelings

## 2013-10-25 ENCOUNTER — Other Ambulatory Visit: Payer: BC Managed Care – PPO

## 2013-10-27 ENCOUNTER — Inpatient Hospital Stay: Admission: RE | Admit: 2013-10-27 | Payer: BC Managed Care – PPO | Source: Ambulatory Visit

## 2014-02-07 ENCOUNTER — Ambulatory Visit (INDEPENDENT_AMBULATORY_CARE_PROVIDER_SITE_OTHER): Payer: 59 | Admitting: Family Medicine

## 2014-02-07 ENCOUNTER — Encounter: Payer: Self-pay | Admitting: Family Medicine

## 2014-02-07 VITALS — BP 130/70 | HR 68 | Resp 18 | Ht 63.5 in | Wt 140.1 lb

## 2014-02-07 DIAGNOSIS — F419 Anxiety disorder, unspecified: Secondary | ICD-10-CM

## 2014-02-07 DIAGNOSIS — I10 Essential (primary) hypertension: Secondary | ICD-10-CM

## 2014-02-07 DIAGNOSIS — F411 Generalized anxiety disorder: Secondary | ICD-10-CM

## 2014-02-07 DIAGNOSIS — Z309 Encounter for contraceptive management, unspecified: Secondary | ICD-10-CM

## 2014-02-07 MED ORDER — AMLODIPINE BESYLATE 5 MG PO TABS: 5.0000 mg | ORAL_TABLET | Freq: Every day | ORAL | Status: DC

## 2014-02-07 MED ORDER — NORGESTIMATE-ETH ESTRADIOL 0.25-35 MG-MCG PO TABS: 1.0000 | ORAL_TABLET | Freq: Every day | ORAL | Status: DC

## 2014-02-07 NOTE — Patient Instructions (Signed)
CPE in 6 month, call if you need me before  BP is excellent, continue current med  Keep  walking and weight loss is good  Thankful all is well  Fasting lipid, chem, 7 in 6 months  PLS  start orthotricyclen lo, on day one of next cycle

## 2014-02-07 NOTE — Assessment & Plan Note (Signed)
Controlled, no change in medication DASH diet and commitment to daily physical activity for a minimum of 30 minutes discussed and encouraged, as a part of hypertension management. The importance of attaining a healthy weight is also discussed.  

## 2014-02-08 NOTE — Assessment & Plan Note (Signed)
Pt to start OCP with next cycle, states she had the mirena removed and is not ready to have more chiolren at this tinmme

## 2014-02-08 NOTE — Progress Notes (Signed)
   Subjective:    Patient ID: [name], female    DOB: February 17, 1981, 33 y.o.   MRN: [id number]  HPI The PT is here for follow up and re-evaluation of chronic medical conditions, medication management and review of any available recent lab and radiology data.  Preventive health is updated, specifically  Cancer screening and Immunization.  Declines flu vaccine at this visit Denies adverse s/e with blood pressure medication. Does want contraception as mirena was removed, and not ready for a 3rd child  Currentlyexercising and has lost weight , also employed full time, no anxiety or depression      Review of Systems See HPI Denies recent fever or chills. Denies sinus pressure, nasal congestion, ear pain or sore throat. Denies chest congestion, productive cough or wheezing. Denies chest pains, palpitations and leg swelling Denies abdominal pain, nausea, vomiting,diarrhea or constipation.   Denies dysuria, frequency, hesitancy or incontinence. Denies joint pain, swelling and limitation in mobility. Denies headaches, seizures, numbness, or tingling. Denies depression, anxiety or insomnia. Denies skin break down or rash.        Objective:   Physical Exam BP 130/70  Pulse 68  Resp 18  Ht 5' 3.5" (1.613 m)  Wt 140 lb 1.9 oz (63.558 kg)  BMI 24.43 kg/m2  SpO2 100% Patient alert and oriented and in no cardiopulmonary distress.  HEENT: No facial asymmetry, EOMI,   oropharynx pink and moist.  Neck supple no JVD, no mass.  Chest: Clear to auscultation bilaterally.  CVS: S1, S2 no murmurs, no S3.Regular rate.  ABD: Soft non tender.   Ext: No edema  MS: Adequate ROM spine, shoulders, hips and knees.  Skin: Intact, no ulcerations or rash noted.  Psych: Good eye contact, normal affect. Memory intact not anxious or depressed appearing.  CNS: CN 2-12 intact, power,  normal throughout.no focal deficits noted.        Assessment & Plan:  HTN (hypertension) Controlled,  no change in medication DASH diet and commitment to daily physical activity for a minimum of 30 minutes discussed and encouraged, as a part of hypertension management. The importance of attaining a healthy weight is also discussed.   Contraception management Pt to start OCP with next cycle, states she had the mirena removed and is not ready to have more chiolren at this tinmme  Anxiety Resolved , currently doing very well, employed full time and family doing well together

## 2014-02-08 NOTE — Assessment & Plan Note (Signed)
Resolved , currently doing very well, employed full time and family doing well together

## 2014-02-28 ENCOUNTER — Ambulatory Visit (INDEPENDENT_AMBULATORY_CARE_PROVIDER_SITE_OTHER): Payer: 59 | Admitting: Adult Health

## 2014-02-28 ENCOUNTER — Encounter: Payer: Self-pay | Admitting: Adult Health

## 2014-02-28 VITALS — BP 130/80 | Ht 63.0 in | Wt 141.0 lb

## 2014-02-28 DIAGNOSIS — Z3202 Encounter for pregnancy test, result negative: Secondary | ICD-10-CM

## 2014-02-28 DIAGNOSIS — N949 Unspecified condition associated with female genital organs and menstrual cycle: Secondary | ICD-10-CM

## 2014-02-28 DIAGNOSIS — N925 Other specified irregular menstruation: Secondary | ICD-10-CM

## 2014-02-28 DIAGNOSIS — N926 Irregular menstruation, unspecified: Secondary | ICD-10-CM

## 2014-02-28 HISTORY — DX: Irregular menstruation, unspecified: N92.6

## 2014-02-28 LAB — POCT URINE PREGNANCY: PREG TEST UR: NEGATIVE

## 2014-02-28 MED ORDER — MEGESTROL ACETATE 40 MG PO TABS
ORAL_TABLET | ORAL | Status: DC
Start: 2014-02-28 — End: 2014-05-23

## 2014-02-28 NOTE — Patient Instructions (Signed)
Take megace  Use condoms  Return in 2 weeks for pap and physical

## 2014-02-28 NOTE — Progress Notes (Signed)
Subjective:     Patient ID: [name], female   DOB: November 27, 1980, 33 y.o.   MRN: [id number]  HPI [name] is a 33 year old black female in complaining of bleeding for 7 days, had missed OCs, then stopped taking them, flow still heavy with clots, no pain, last sex over a month ago.  Review of Systems See HPI Reviewed past medical,surgical, social and family history. Reviewed medications and allergies.     Objective:   Physical Exam BP 130/80  Ht 5\' 3"  (1.6 m)  Wt 141 lb (63.957 kg)  BMI 24.98 kg/m2  LMP 02/22/2014  Breastfeeding? NoUPT negative, Skin warm and dry.Pelvic: external genitalia is normal in appearance, vagina: red period like blood without odor, cervix:smooth, uterus: normal size, shape and contour, non tender, no masses felt, adnexa: no masses or tenderness noted. GC/CHL obtained.     Assessment:     Prolonged period    Plan:     Rx megace 40 mg #45 3 x 5 days then 2 x 5 days then 1 daily  Use condoms  Return in 2 weeks for pap and physical Will restart OCs after bleeding stopped

## 2014-03-01 LAB — GC/CHLAMYDIA PROBE AMP
CT PROBE, AMP APTIMA: NEGATIVE
GC PROBE AMP APTIMA: NEGATIVE

## 2014-03-15 ENCOUNTER — Other Ambulatory Visit: Payer: 59 | Admitting: Adult Health

## 2014-04-08 ENCOUNTER — Encounter: Payer: Self-pay | Admitting: Adult Health

## 2014-04-15 ENCOUNTER — Telehealth: Payer: Self-pay

## 2014-04-15 MED ORDER — FLUCONAZOLE 150 MG PO TABS: 150.0000 mg | ORAL_TABLET | Freq: Once | ORAL | Status: DC

## 2014-04-15 MED ORDER — DOXYCYCLINE HYCLATE 100 MG PO TABS: 100.0000 mg | ORAL_TABLET | Freq: Two times a day (BID) | ORAL | Status: DC

## 2014-04-15 NOTE — Telephone Encounter (Signed)
Recommend doxycycline 100 mg  twiced daily # 14 only , also offer fluconazole # 2 if needed for yeast, call back if persists

## 2014-04-15 NOTE — Telephone Encounter (Signed)
Patient aware and meds sent to requested pharmacy.

## 2014-04-15 NOTE — Telephone Encounter (Signed)
error 

## 2014-04-15 NOTE — Addendum Note (Signed)
Addended by: Denman George B on: 04/15/2014 05:05 PM   Modules accepted: Orders

## 2014-05-07 DIAGNOSIS — L02429 Furuncle of limb, unspecified: Secondary | ICD-10-CM

## 2014-05-07 HISTORY — DX: Furuncle of limb, unspecified: L02.429

## 2014-05-16 ENCOUNTER — Other Ambulatory Visit: Payer: Self-pay | Admitting: Family Medicine

## 2014-05-23 ENCOUNTER — Encounter: Payer: Self-pay | Admitting: Family Medicine

## 2014-05-23 ENCOUNTER — Ambulatory Visit (INDEPENDENT_AMBULATORY_CARE_PROVIDER_SITE_OTHER): Payer: 59 | Admitting: Family Medicine

## 2014-05-23 ENCOUNTER — Other Ambulatory Visit (HOSPITAL_COMMUNITY)
Admission: RE | Admit: 2014-05-23 | Discharge: 2014-05-23 | Disposition: A | Discharge status: Home / Self Care | Payer: 59 | Source: Ambulatory Visit | Attending: Family Medicine | Admitting: Family Medicine

## 2014-05-23 ENCOUNTER — Ambulatory Visit (INDEPENDENT_AMBULATORY_CARE_PROVIDER_SITE_OTHER): Payer: 59

## 2014-05-23 VITALS — BP 114/74 | HR 83 | Resp 16 | Ht 63.0 in | Wt 142.0 lb

## 2014-05-23 DIAGNOSIS — N926 Irregular menstruation, unspecified: Secondary | ICD-10-CM

## 2014-05-23 DIAGNOSIS — Z113 Encounter for screening for infections with a predominantly sexual mode of transmission: Secondary | ICD-10-CM | POA: Insufficient documentation

## 2014-05-23 DIAGNOSIS — N760 Acute vaginitis: Secondary | ICD-10-CM

## 2014-05-23 DIAGNOSIS — I10 Essential (primary) hypertension: Secondary | ICD-10-CM

## 2014-05-23 DIAGNOSIS — Z23 Encounter for immunization: Secondary | ICD-10-CM

## 2014-05-23 DIAGNOSIS — L0292 Furuncle, unspecified: Secondary | ICD-10-CM

## 2014-05-23 LAB — POCT URINE PREGNANCY: PREG TEST UR: NEGATIVE

## 2014-05-23 MED ORDER — METRONIDAZOLE 500 MG PO TABS: 500.0000 mg | ORAL_TABLET | Freq: Two times a day (BID) | ORAL | Status: DC

## 2014-05-23 NOTE — Patient Instructions (Addendum)
F/u in 4.5 month, call if you  Need me before  Flu vaccine today  Medication sent for vaginal d/c and boil  Resume contraception today, as  pregnancy test is negative

## 2014-05-23 NOTE — Assessment & Plan Note (Signed)
Present on right inner thigh  flagyll prescribed

## 2014-05-23 NOTE — Assessment & Plan Note (Signed)
Urine pregnancy test, if negative pt to resume oCP

## 2014-05-23 NOTE — Progress Notes (Signed)
   Subjective:    Patient ID: [name], female    DOB: 16-Dec-1980, 33 y.o.   MRN: [id number]  HPI H./o heavy bleed x 3 weekas in November, on no contraception,LMP12/12/2013, wants to resume contraception Increased yellow vaginal d/c x 1 week Boil on left inner thigh x 1 week, 1 burst before, no fever or chills. Marital stress but coping  Review of Systems See HPI Denies recent fever or chills. Denies sinus pressure, nasal congestion, ear pain or sore throat. Denies chest congestion, productive cough or wheezing. Denies chest pains, palpitations and leg swelling Denies abdominal pain, nausea, vomiting,diarrhea or constipation.   Denies dysuria, frequency, hesitancy or incontinence. Denies joint pain, swelling and limitation in mobility. Denies headaches, seizures, numbness, or tingling.        Objective:   Physical Exam BP 114/74 mmHg  Pulse 83  Resp 16  Ht 5\' 3"  (1.6 m)  Wt 142 lb (64.411 kg)  BMI 25.16 kg/m2  SpO2 99% Patient alert and oriented and in no cardiopulmonary distress.  HEENT: No facial asymmetry, EOMI,   oropharynx pink and moist.  Neck supple no JVD, no mass.  Chest: Clear to auscultation bilaterally.  CVS: S1, S2 no murmurs, no S3.Regular rate.  ABD: Soft non tender.  Pelvic: copious yellow vaginal d/c no cervical motion or adnexal tenderness Ext: No edema  MS: Adequate ROM spine, shoulders, hips and knees.  Skin:healing boil on right inner thigh, new tender boin on right inner thigh.  Psych: Good eye contact, normal affect. Memory intact not anxious mildly depressed appearing.  CNS: CN 2-12 intact, power,  normal throughout.no focal deficits noted.        Assessment & Plan:  Vaginitis and vulvovaginitis Specimens sent flagyll prescribed  HTN (hypertension) Controlled, no change in medication   Prolonged periods Urine pregnancy test, if negative pt to resume oCP  Boil Present on right inner thigh  flagyll prescribed  Need  for prophylactic vaccination and inoculation against influenza Vaccine administered at visit.

## 2014-05-23 NOTE — Assessment & Plan Note (Signed)
Specimens sent flagyll prescribed

## 2014-05-23 NOTE — Assessment & Plan Note (Signed)
Vaccine administered at visit.  

## 2014-05-23 NOTE — Assessment & Plan Note (Signed)
Controlled, no change in medication  

## 2014-05-24 LAB — CERVICOVAGINAL ANCILLARY ONLY
Chlamydia: NEGATIVE
NEISSERIA GONORRHEA: NEGATIVE

## 2014-05-27 LAB — CERVICOVAGINAL ANCILLARY ONLY
Wet Prep (BD Affirm): NEGATIVE
Wet Prep (BD Affirm): NEGATIVE
Wet Prep (BD Affirm): POSITIVE — AB

## 2014-07-31 ENCOUNTER — Other Ambulatory Visit: Payer: Self-pay

## 2014-07-31 DIAGNOSIS — Z309 Encounter for contraceptive management, unspecified: Secondary | ICD-10-CM

## 2014-07-31 MED ORDER — NORGESTIMATE-ETH ESTRADIOL 0.25-35 MG-MCG PO TABS: 1.0000 | ORAL_TABLET | Freq: Every day | ORAL | Status: DC

## 2014-08-12 ENCOUNTER — Encounter: Payer: 59 | Admitting: Family Medicine

## 2014-08-29 ENCOUNTER — Encounter: Payer: 59 | Admitting: Family Medicine

## 2014-09-03 ENCOUNTER — Telehealth: Payer: Self-pay

## 2014-09-03 ENCOUNTER — Other Ambulatory Visit: Payer: Self-pay

## 2014-09-03 MED ORDER — FLUCONAZOLE 150 MG PO TABS: 150.0000 mg | ORAL_TABLET | Freq: Once | ORAL | Status: DC

## 2014-09-03 MED ORDER — DOXYCYCLINE HYCLATE 100 MG PO TABS: 100.0000 mg | ORAL_TABLET | Freq: Two times a day (BID) | ORAL | Status: DC

## 2014-09-03 NOTE — Telephone Encounter (Signed)
pls send doxycycline 100mg  twwice daily for 1 week and fluconazole 150mg  #1 if she needs

## 2014-09-03 NOTE — Telephone Encounter (Signed)
Patient aware.

## 2014-09-03 NOTE — Telephone Encounter (Signed)
At work and unable to come in for appt but has another boil in her private area (upper inner thigh) and its not draining yet but its very tender, red and hurts some when she's walking. Uses [facility] on [address]. Wants the same med that was sent in last time if able.

## 2014-09-10 ENCOUNTER — Ambulatory Visit (INDEPENDENT_AMBULATORY_CARE_PROVIDER_SITE_OTHER): Payer: 59 | Admitting: Family Medicine

## 2014-09-10 ENCOUNTER — Encounter: Payer: Self-pay | Admitting: Family Medicine

## 2014-09-10 VITALS — BP 128/80 | HR 80 | Resp 18 | Ht 63.0 in | Wt 141.1 lb

## 2014-09-10 DIAGNOSIS — I10 Essential (primary) hypertension: Secondary | ICD-10-CM

## 2014-09-10 DIAGNOSIS — Z309 Encounter for contraceptive management, unspecified: Secondary | ICD-10-CM | POA: Diagnosis not present

## 2014-09-10 MED ORDER — AMLODIPINE BESYLATE 5 MG PO TABS: 5.0000 mg | ORAL_TABLET | Freq: Every day | ORAL | Status: DC

## 2014-09-10 MED ORDER — NORGESTIMATE-ETH ESTRADIOL 0.25-35 MG-MCG PO TABS: 1.0000 | ORAL_TABLET | Freq: Every day | ORAL | Status: DC

## 2014-09-10 NOTE — Patient Instructions (Addendum)
F/u in 6 month, please call if you need me before  Blood pressure medication and birth control pills will be sent in for 7 months  Consider the things we discussed and call for any additional help that you need  I believe and hope that overall things improve, small changes in the right direction add up to big changes in the long run!  No more antibiotics needs for sore on left inner thigh at this time  CBC, fasting lipid and chem 7 in 6 month

## 2014-09-16 ENCOUNTER — Ambulatory Visit: Payer: 59 | Admitting: Family Medicine

## 2014-10-13 NOTE — Assessment & Plan Note (Signed)
Controlled, no change in medication DASH diet and commitment to daily physical activity for a minimum of 30 minutes discussed and encouraged, as a part of hypertension management. The importance of attaining a healthy weight is also discussed.  BP/Weight 09/10/2014 05/23/2014 02/28/2014 02/07/2014 10/16/2013 0/0/8676 06/16/5091  Systolic BP 267 124 580 998 338 250 539  Diastolic BP 80 74 80 70 767 85 92  Wt. (Lbs) 141.08 142 141 140.12 151.4 - 158  BMI 25 25.16 24.98 24.43 26.83 - 28

## 2014-10-13 NOTE — Assessment & Plan Note (Signed)
Oral contraceptives prescribed for an additional 7 months

## 2014-10-13 NOTE — Progress Notes (Signed)
   [name]     MRN: [id number]      DOB: [birth year]   HPI [name] is here for follow up and re-evaluation of chronic medical conditions, medication management and review of any available recent lab and radiology data.  Preventive health is updated, specifically  Cancer screening and Immunization.   Questions or concerns regarding consultations or procedures which the PT has had in the interim are  addressed. The PT denies any adverse reactions to current medications since the last visit.  Ongoing concerns re relationship with her spouse , which she states is getting worse, he is more withdrawn and less engaged with taking care of the children.Not much sexual activity or intimacy. Wants sore on left inner thigh which she was just taking antibiotics for checked, not painful, no drainage    ROS Denies recent fever or chills. Denies sinus pressure, nasal congestion, ear pain or sore throat. Denies chest congestion, productive cough or wheezing. Denies chest pains, palpitations and leg swelling Denies abdominal pain, nausea, vomiting,diarrhea or constipation.   Denies dysuria, frequency, hesitancy or incontinence. Denies joint pain, swelling and limitation in mobility. Denies headaches, seizures, numbness, or tingling. [name]  PE  BP 128/80 mmHg  Pulse 80  Resp 18  Ht 5\' 3"  (1.6 m)  Wt 141 lb 1.3 oz (63.993 kg)  BMI 25.00 kg/m2  SpO2 100%  Patient alert and oriented and in no cardiopulmonary distress.  HEENT: No facial asymmetry, EOMI,   oropharynx pink and moist.  Neck supple no JVD, no mass.  Chest: Clear to auscultation bilaterally.  CVS: S1, S2 no murmurs, no S3.Regular rate.  ABD: Soft non tender.   Ext: No edema  MS: Adequate ROM spine, shoulders, hips and knees.  Skin: Intact, no ulcerations or rash noted.Healed boil on inner thigh, no eryhtema or warmth  Psych: Good eye contact, normal affect. Memory intact not anxious or depressed appearing.  CNS: CN 2-12  intact, power,  normal throughout.no focal deficits noted.   Assessment & Plan   HTN (hypertension) Controlled, no change in medication DASH diet and commitment to daily physical activity for a minimum of 30 minutes discussed and encouraged, as a part of hypertension management. The importance of attaining a healthy weight is also discussed.  BP/Weight 09/10/2014 05/23/2014 02/28/2014 02/07/2014 10/16/2013 01/13/9168 09/10/386  Systolic BP 828 003 491 791 505 697 948  Diastolic BP 80 74 80 70 016 85 92  Wt. (Lbs) 141.08 142 141 140.12 151.4 - 158  BMI 25 25.16 24.98 24.43 26.83 - 28         Contraception management Oral contraceptives prescribed for an additional 7 months

## 2014-11-05 ENCOUNTER — Telehealth: Payer: Self-pay

## 2014-11-05 MED ORDER — FLUCONAZOLE 150 MG PO TABS: 150.0000 mg | ORAL_TABLET | Freq: Once | ORAL | Status: DC

## 2014-11-05 NOTE — Telephone Encounter (Signed)
Patient aware and med sent to requested pharmacy.  

## 2014-11-05 NOTE — Addendum Note (Signed)
Addended by: Denman George B on: 11/05/2014 10:48 AM   Modules accepted: Orders

## 2014-11-05 NOTE — Telephone Encounter (Signed)
pls send fluconazole 150mg  #1 only

## 2014-11-06 ENCOUNTER — Telehealth: Payer: Self-pay

## 2014-11-06 NOTE — Telephone Encounter (Signed)
Ov in am for wet prep and culture only please if she is able to come in, I will send the swabs

## 2014-11-06 NOTE — Telephone Encounter (Signed)
Noted.  Patient will come in at 40

## 2014-11-07 ENCOUNTER — Other Ambulatory Visit: Payer: Self-pay | Admitting: Family Medicine

## 2014-11-07 ENCOUNTER — Ambulatory Visit (INDEPENDENT_AMBULATORY_CARE_PROVIDER_SITE_OTHER): Payer: 59 | Admitting: Family Medicine

## 2014-11-07 ENCOUNTER — Encounter: Payer: Self-pay | Admitting: Family Medicine

## 2014-11-07 ENCOUNTER — Other Ambulatory Visit (HOSPITAL_COMMUNITY)
Admission: RE | Admit: 2014-11-07 | Discharge: 2014-11-07 | Disposition: A | Discharge status: Home / Self Care | Payer: 59 | Source: Ambulatory Visit | Attending: Family Medicine | Admitting: Family Medicine

## 2014-11-07 VITALS — BP 122/80 | HR 93 | Resp 16 | Ht 63.0 in | Wt 138.1 lb

## 2014-11-07 DIAGNOSIS — Z113 Encounter for screening for infections with a predominantly sexual mode of transmission: Secondary | ICD-10-CM | POA: Insufficient documentation

## 2014-11-07 DIAGNOSIS — N760 Acute vaginitis: Secondary | ICD-10-CM

## 2014-11-07 DIAGNOSIS — I10 Essential (primary) hypertension: Secondary | ICD-10-CM

## 2014-11-07 NOTE — Progress Notes (Signed)
   Subjective:    Patient ID: [name], female    DOB: 1981-03-08, 34 y.o.   MRN: [id number]  HPI 4 day h/o vaginal itch and increased discharge. Denies fever or chills, denies urinary symptoms, currently sexually active   Review of Systems See HPI Denies recent fever or chills. Denies sinus pressure, nasal congestion, ear pain or sore throat. Denies chest congestion, productive cough or wheezing. Denies chest pains, palpitations and leg swelling Denies abdominal pain, nausea, vomiting,diarrhea or constipation.   Denies dysuria, frequency, hesitancy or incontinence.       Objective:   Physical Exam BP 122/80 mmHg  Pulse 93  Resp 16  Ht 5\' 3"  (1.6 m)  Wt 138 lb 1.9 oz (62.651 kg)  BMI 24.47 kg/m2  SpO2 100%  LMP 11/03/2014  Patient alert and oriented and in no cardiopulmonary distress.  HEENT: No facial asymmetry, EOMI,   oropharynx pink and moist.  Neck supple no JVD, no mass.    ABD: Soft non tender. No renal angle or suprapubic tenderness  Pelvic: uterus normal sized, cervix firm, copious yellow discharge,  Slightly malodorous, No cervical motion or adnexal tenderness, no adnexal mass. No visible ulcers  Ext: No edema  MS: Adequate ROM spine, shoulders, hips and knees.   Psych: Good eye contact, normal affect. Memory intact not anxious or depressed appearing.         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 The importance of attaining a healthy weight is also discussed.  BP/Weight 11/07/2014 09/10/2014 05/23/2014 02/28/2014 02/07/2014 1/59/4585 02/07/9243  Systolic BP 628 638 177 116 579 038 333  Diastolic BP 80 80 74 80 70 100 85  Wt. (Lbs) 138.12  141.08 142 141 140.12 151.4 -  BMI 24.47 25 25.16 24.98 24.43 26.83 -

## 2014-11-07 NOTE — Assessment & Plan Note (Signed)
Controlled, no change in medication DASH diet and commitment to daily physical activity for a minimum of 30 minutes discussed and encouraged, as a part of hypertension management. The importance of attaining a healthy weight is also discussed.  BP/Weight 11/07/2014 09/10/2014 05/23/2014 02/28/2014 02/07/2014 4/98/2641 10/12/3092  Systolic BP 076 808 811 031 594 585 929  Diastolic BP 80 80 74 80 70 100 85  Wt. (Lbs) 138.12 141.08 142 141 140.12 151.4 -  BMI 24.47 25 25.16 24.98 24.43 26.83 -

## 2014-11-07 NOTE — Assessment & Plan Note (Signed)
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

## 2014-11-07 NOTE — Patient Instructions (Signed)
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 1. Commitment to daily physical activity for 30 to 60  minutes, if you are able to do this.  2. Commitment to wise food choices. Aim for half of your  food intake to be vegetable and fruit, one quarter starchy foods, and one quarter protein. Try to eat on a regular schedule  3 meals per day, snacking between meals should be limited to vegetables or fruits or small portions of nuts. 64 ounces of water per day is generally recommended, unless you have specific health conditions, like heart failure or kidney failure where you will need to limit fluid intake.  3. Commitment to sufficient and a  good quality of physical and mental rest daily, generally between 6 to 8 hours per day.  WITH PERSISTANCE AND PERSEVERANCE, THE IMPOSSIBLE , BECOMES THE NORM!   Thanks for choosing [facility], we consider it a privelige to serve you.

## 2014-11-08 LAB — BASIC METABOLIC PANEL
BUN: 17 mg/dL (ref 6–23)
CALCIUM: 9.1 mg/dL (ref 8.4–10.5)
CHLORIDE: 105 meq/L (ref 96–112)
CO2: 26 mEq/L (ref 19–32)
CREATININE: 0.77 mg/dL (ref 0.50–1.10)
Glucose, Bld: 87 mg/dL (ref 70–99)
Potassium: 4.3 mEq/L (ref 3.5–5.3)
Sodium: 138 mEq/L (ref 135–145)

## 2014-11-08 LAB — CBC
HCT: 33.9 % — ABNORMAL LOW (ref 36.0–46.0)
HEMOGLOBIN: 10.7 g/dL — AB (ref 12.0–15.0)
MCH: 24.4 pg — AB (ref 26.0–34.0)
MCHC: 31.6 g/dL (ref 30.0–36.0)
MCV: 77.4 fL — AB (ref 78.0–100.0)
MPV: 10.6 fL (ref 8.6–12.4)
Platelets: 342 10*3/uL (ref 150–400)
RBC: 4.38 MIL/uL (ref 3.87–5.11)
RDW: 15.7 % — ABNORMAL HIGH (ref 11.5–15.5)
WBC: 6.2 10*3/uL (ref 4.0–10.5)

## 2014-11-08 LAB — LIPID PANEL
CHOLESTEROL: 181 mg/dL (ref 0–200)
HDL: 48 mg/dL (ref >=46)
LDL Cholesterol: 119 mg/dL — ABNORMAL HIGH (ref 0–99)
TRIGLYCERIDES: 71 mg/dL (ref <150)
Total CHOL/HDL Ratio: 3.8 Ratio
VLDL: 14 mg/dL (ref 0–40)

## 2014-11-08 LAB — CERVICOVAGINAL ANCILLARY ONLY
CHLAMYDIA, DNA PROBE: NEGATIVE
Neisseria Gonorrhea: NEGATIVE
Wet Prep (BD Affirm): POSITIVE — AB

## 2014-11-11 LAB — FERRITIN: Ferritin: 10 ng/mL (ref 10–291)

## 2014-11-11 LAB — IRON: Iron: 58 ug/dL (ref 42–145)

## 2014-11-11 LAB — FOLATE: FOLATE: 6.4 ng/mL

## 2014-11-11 LAB — HSV 2 ANTIBODY, IGG

## 2014-11-11 MED ORDER — METRONIDAZOLE 500 MG PO TABS: 2000.0000 mg | ORAL_TABLET | Freq: Once | ORAL | Status: DC

## 2014-11-11 NOTE — Addendum Note (Signed)
Addended by: Denman George B on: 11/11/2014 03:07 PM   Modules accepted: Orders

## 2014-11-12 LAB — HIV ANTIBODY (ROUTINE TESTING W REFLEX): HIV 1&2 Ab, 4th Generation: NONREACTIVE

## 2014-11-13 ENCOUNTER — Telehealth: Payer: Self-pay | Admitting: Family Medicine

## 2014-11-13 NOTE — Telephone Encounter (Signed)
Pls contact pt , she needs a   f/u visit in  for trichomonas, for pelvic exam and re testing. female does not need re testing , this is the new and updated recommendattion  Please also schedule , when you speak with her, thanks

## 2014-11-22 NOTE — Telephone Encounter (Signed)
Letter sent for patient to contact office

## 2014-12-02 ENCOUNTER — Ambulatory Visit (INDEPENDENT_AMBULATORY_CARE_PROVIDER_SITE_OTHER): Payer: 59 | Admitting: Family Medicine

## 2014-12-02 ENCOUNTER — Encounter: Payer: Self-pay | Admitting: Family Medicine

## 2014-12-02 ENCOUNTER — Other Ambulatory Visit (HOSPITAL_COMMUNITY)
Admission: RE | Admit: 2014-12-02 | Discharge: 2014-12-02 | Disposition: A | Discharge status: Home / Self Care | Payer: 59 | Source: Ambulatory Visit | Attending: Family Medicine | Admitting: Family Medicine

## 2014-12-02 VITALS — BP 126/80 | HR 90 | Resp 18 | Ht 63.0 in | Wt 137.0 lb

## 2014-12-02 DIAGNOSIS — N926 Irregular menstruation, unspecified: Secondary | ICD-10-CM | POA: Diagnosis not present

## 2014-12-02 DIAGNOSIS — I10 Essential (primary) hypertension: Secondary | ICD-10-CM | POA: Diagnosis not present

## 2014-12-02 DIAGNOSIS — Z638 Other specified problems related to primary support group: Secondary | ICD-10-CM | POA: Diagnosis not present

## 2014-12-02 DIAGNOSIS — N760 Acute vaginitis: Secondary | ICD-10-CM | POA: Diagnosis present

## 2014-12-02 DIAGNOSIS — Z113 Encounter for screening for infections with a predominantly sexual mode of transmission: Secondary | ICD-10-CM | POA: Diagnosis not present

## 2014-12-02 DIAGNOSIS — F439 Reaction to severe stress, unspecified: Secondary | ICD-10-CM

## 2014-12-02 NOTE — Assessment & Plan Note (Signed)
Over 6 month h/o deterioration in relationship , which escalated this past weekend with spouse telling her that he wants a divorce. She is considering therapy, individual or couple, she is uncertain what she wants to do Sad, not currently clinically depressed, not suicidal or homicidal Made myself available for them both to come in for a visit if they wanted to, she was grateful for the offer

## 2014-12-02 NOTE — Progress Notes (Signed)
   [name]     MRN: [id number]      DOB: Oct 18, 1980   HPI [name] is here for follow up and re-evaluation of recently diagnosed trichomonas infection, and the ramifications of acquiring a STD from her spouse.   ROS Denies recent fever or chills. Denies sinus pressure, nasal congestion, ear pain or sore throat. Denies chest congestion, productive cough or wheezing. Denies chest pains, palpitations and leg swelling Denies abdominal pain, nausea, vomiting,diarrhea or constipation.   Denies dysuria, frequency, hesitancy or incontinence. Denies any current vaginal discharge or odor Depressed and acutely reacting to hearing her spouse tell her this past weekend that he wants to  Divorce her, she herself is unsure what she wants as the relationship has been strained for a while, she has no one to get counseling from or who she will speak with about it at this time, not suicidal or homicidal, will call in if she decides to get counseling either individually or as a couple  PE  BP 126/80 mmHg  Pulse 90  Resp 18  Ht 5\' 3"  (1.6 m)  Wt 137 lb (62.143 kg)  BMI 24.27 kg/m2  SpO2 100%  LMP 11/03/2014  Patient alert and oriented and in no cardiopulmonary distress.  HEENT: No facial asymmetry, EOMI,   Neck supple  Chest: Clear to auscultation bilaterally.  CVS: S1, S2 no murmurs, no S3.Regular rate.  Pelvic: external genitalia normal female distribution of hair, no ulcers noted at introitus , no vaginal discharge Internal: vaginal mucosa moist, physiologic discharge, neither copious or malodorous, no cervical or adnexal motion tenderness. Cervix firm, uterus not enlarged MS: Adequate ROM spine, shoulders, hips and knees.  Skin: Intact, no ulcerations or rash noted.  Psych: Good eye contact, flat affect. Memory intact not anxious , but depressed appearing.  CNS: CN 2-12 intact, power,  normal throughout.no focal deficits noted.   Assessment & Plan   HTN  (hypertension) Controlled, no change in medication DASH diet and commitment to daily physical activity for a minimum of 30 minutes discussed and encouraged, as a part of hypertension management. The importance of attaining a healthy weight is also discussed.  BP/Weight 12/02/2014 11/07/2014 09/10/2014 05/23/2014 02/28/2014 02/07/2014 9/62/9528  Systolic BP 413 244 010 272 536 644 034  Diastolic BP 80 80 80 74 80 70 100  Wt. (Lbs) 137 138.12 141.08 142 141 140.12 151.4  BMI 24.27 24.47 25 25.16 24.98 24.43 26.83        Vaginitis and vulvovaginitis Specimens sent for re testing, clinical exam is normal and she is asymptomatic  Prolonged periods Corrected with OCP  Stress at home Over 6 month h/o deterioration in relationship , which escalated this past weekend with spouse telling her that he wants a divorce. She is considering therapy, individual or couple, she is uncertain what she wants to do Sad, not currently clinically depressed, not suicidal or homicidal Made myself available for them both to come in for a visit if they wanted to, she was grateful for the offer

## 2014-12-02 NOTE — Assessment & Plan Note (Signed)
Specimens sent for re testing, clinical exam is normal and she is asymptomatic

## 2014-12-02 NOTE — Assessment & Plan Note (Signed)
Controlled, no change in medication DASH diet and commitment to daily physical activity for a minimum of 30 minutes discussed and encouraged, as a part of hypertension management. The importance of attaining a healthy weight is also discussed.  BP/Weight 12/02/2014 11/07/2014 09/10/2014 05/23/2014 02/28/2014 02/07/2014 7/82/4235  Systolic BP 361 443 154 008 676 195 093  Diastolic BP 80 80 80 74 80 70 100  Wt. (Lbs) 137 138.12 141.08 142 141 140.12 151.4  BMI 24.27 24.47 25 25.16 24.98 24.43 26.83

## 2014-12-02 NOTE — Patient Instructions (Addendum)
CPE and pap in 5 months.  Specimens today  It is important that you exercise regularly at least 30 minutes 5 times a week. If you develop chest pain, have severe difficulty breathing, or feel very tired, stop exercising immediately and seek medical attention    Please work on good  health habits so that your health will improve. 1. Commitment to daily physical activity for 30 to 60  minutes, if you are able to do this.  2. Commitment to wise food choices. Aim for half of your  food intake to be vegetable and fruit, one quarter starchy foods, and one quarter protein. Try to eat on a regular schedule  3 meals per day, snacking between meals should be limited to vegetables or fruits or small portions of nuts. 64 ounces of water per day is generally recommended, unless you have specific health conditions, like heart failure or kidney failure where you will need to limit fluid intake.  3. Commitment to sufficient and a  good quality of physical and mental rest daily, generally between 6 to 8 hours per day.  WITH PERSISTANCE AND PERSEVERANCE, THE IMPOSSIBLE , BECOMES THE NORM!  Call fo referral if needed

## 2014-12-02 NOTE — Assessment & Plan Note (Signed)
Corrected with OCP

## 2014-12-03 LAB — CERVICOVAGINAL ANCILLARY ONLY
CHLAMYDIA, DNA PROBE: NEGATIVE
NEISSERIA GONORRHEA: NEGATIVE

## 2014-12-04 LAB — CERVICOVAGINAL ANCILLARY ONLY: WET PREP (BD AFFIRM): POSITIVE — AB

## 2014-12-05 ENCOUNTER — Encounter: Payer: Self-pay | Admitting: Family Medicine

## 2014-12-06 ENCOUNTER — Other Ambulatory Visit: Payer: Self-pay

## 2014-12-06 MED ORDER — METRONIDAZOLE 500 MG PO TABS: 500.0000 mg | ORAL_TABLET | Freq: Two times a day (BID) | ORAL | Status: DC

## 2015-01-05 ENCOUNTER — Encounter (HOSPITAL_COMMUNITY): Payer: Self-pay | Admitting: Emergency Medicine

## 2015-01-05 ENCOUNTER — Emergency Department (INDEPENDENT_AMBULATORY_CARE_PROVIDER_SITE_OTHER)
Admission: EM | Admit: 2015-01-05 | Discharge: 2015-01-05 | Disposition: A | Discharge status: Home / Self Care | Payer: 59 | Source: Home / Self Care | Attending: Family Medicine | Admitting: Family Medicine

## 2015-01-05 DIAGNOSIS — R0789 Other chest pain: Secondary | ICD-10-CM

## 2015-01-05 DIAGNOSIS — R071 Chest pain on breathing: Secondary | ICD-10-CM

## 2015-01-05 MED ORDER — NAPROXEN 500 MG PO TABS: ORAL_TABLET | ORAL | Status: DC

## 2015-01-05 NOTE — Discharge Instructions (Signed)
Costochondritis Costochondritis is a condition in which the tissue (cartilage) that connects your ribs with your breastbone (sternum) becomes irritated. It causes pain in the chest and rib area. It usually goes away on its own over time. HOME CARE  Avoid activities that wear you out.  Do not strain your ribs. Avoid activities that use your:  Chest.  Belly.  Side muscles.  Put ice on the area for the first 2 days after the pain starts.  Put ice in a plastic bag.  Place a towel between your skin and the bag.  Leave the ice on for 20 minutes, 2-3 times a day.  Only take medicine as told by your doctor. GET HELP IF:  You have redness or puffiness (swelling) in the rib area.  Your pain does not go away with rest or medicine. GET HELP RIGHT AWAY IF:   Your pain gets worse.  You are very uncomfortable.  You have trouble breathing.  You cough up blood.  You start sweating or throwing up (vomiting).  You have a fever or lasting symptoms for more than 2-3 days.  You have a fever and your symptoms suddenly get worse. MAKE SURE YOU:   Understand these instructions.  Will watch your condition.  Will get help right away if you are not doing well or get worse. Document Released: 11/10/2007 Document Revised: 01/24/2013 Document Reviewed: 12/26/2012 [facility] Patient Information 2015 [place], [place]. This information is not intended to replace advice given to you by your health care provider. Make sure you discuss any questions you have with your health care provider.    Your EKG looked great. Exam is great. Treat for inflammatory chest pain with Naprosyn. Take twice daily for 7-10 days to fully resolve this. F/U if worsens.

## 2015-01-05 NOTE — ED Provider Notes (Signed)
CSN: [id number]     Arrival date & time 01/05/15  1528 History   First MD Initiated Contact with Patient 01/05/15 1701     Chief Complaint  Patient presents with  . Chest Pain   (Consider location/radiation/quality/duration/timing/severity/associated sxs/prior Treatment) HPI Comments: Patient is a 34 yo black female who presents with sternal chest pain. Onset last pm. Pain is noted with lifting her 45 yo son and twisting to put him down. The pain does not radiate. No associated dyspnea or cough. No diaphoresis. No N, V. No cardiac history. It is improving some as she has waited.   Patient is a 34 y.o. female presenting with chest pain. The history is provided by the patient.  Chest Pain   Past Medical History  Diagnosis Date  . Axillary abscess 2014    Left axillary abscess  . Pregnancy induced hypertension   . Prolonged periods 02/28/2014   Past Surgical History  Procedure Laterality Date  . Incise and drain abcess  2014   Family History  Problem Relation Age of Onset  . Hypertension Mother   . Hypertension Father   . Diabetes Maternal Aunt   . Diabetes Maternal Grandmother   . Diabetes Maternal Grandfather   . Hypertension Paternal Grandmother    History  Substance Use Topics  . Smoking status: Never Smoker   . Smokeless tobacco: Never Used  . Alcohol Use: No   OB History    Gravida Para Term Preterm AB TAB SAB Ectopic Multiple Living   2 2 1 1      2      Review of Systems  Cardiovascular: Positive for chest pain.  All other systems reviewed and are negative.   Allergies  Penicillins  Home Medications   Prior to Admission medications   Medication Sig Start Date End Date Taking? Authorizing Provider  amLODipine (NORVASC) 5 MG tablet Take 1 tablet (5 mg total) by mouth daily. 09/10/14   Fayrene Helper, MD  metroNIDAZOLE (FLAGYL) 500 MG tablet Take 1 tablet (500 mg total) by mouth 2 (two) times daily. 12/06/14   Fayrene Helper, MD  naproxen (NAPROSYN) 500  MG tablet Take 1 tablet po bid pc x 7-10 days for chest wall pain then prn pain 01/05/15   Bjorn Pippin, PA-C  norgestimate-ethinyl estradiol (ORTHO-CYCLEN, 28,) 0.25-35 MG-MCG tablet Take 1 tablet by mouth daily. 09/10/14   Fayrene Helper, MD   BP 151/76 mmHg  Pulse 67  Temp(Src) 98.2 F (36.8 C) (Oral)  Resp 16  SpO2 100%  LMP 12/19/2014 Physical Exam  Constitutional: She is oriented to person, place, and time. She appears well-developed and well-nourished. No distress.  HENT:  Head: Normocephalic and atraumatic.  Neck: Normal range of motion.  Pulmonary/Chest: Effort normal. No respiratory distress. She has no wheezes. She exhibits tenderness.  Reproducible sternal pain with palpation and ROM  Musculoskeletal: Normal range of motion. She exhibits tenderness. She exhibits no edema.  Neurological: She is alert and oriented to person, place, and time.  Skin: Skin is warm and dry. No rash noted. She is not diaphoretic.  Psychiatric: Her behavior is normal.  Nursing note and vitals reviewed.   ED Course  EKG  Date/Time: 01/05/2015 5:25 PM Performed by: Bjorn Pippin Authorized by: Kinnie Feil Interpreted by ED physician Comparison: not compared with previous ECG  Previous ECG: no previous ECG available Rhythm: sinus rhythm Rate: normal QRS axis: normal Conduction: conduction normal ST Segments: ST segments normal T Waves: T  waves normal Other: no other findings Clinical impression: normal ECG   (including critical care time) Labs Review Labs Reviewed - No data to display  Imaging Review No results found.   MDM   1. Chest wall pain   2. Costochondral chest pain    No concern for cardiac history. EKG normal and reproducible pain. Treat with NSAIDs and rest. F/U if any emergent concerns.     Bjorn Pippin, PA-C 01/05/15 1726

## 2015-01-05 NOTE — ED Notes (Signed)
Pt states that she has been having chest pain on the right side since last night she states that it hurts when she turns or tries to pick up her 34 year old child

## 2015-01-08 ENCOUNTER — Other Ambulatory Visit: Payer: Self-pay

## 2015-01-08 ENCOUNTER — Encounter: Payer: Self-pay | Admitting: Family Medicine

## 2015-01-08 ENCOUNTER — Telehealth: Payer: Self-pay | Admitting: *Deleted

## 2015-01-08 MED ORDER — FLUCONAZOLE 150 MG PO TABS: 150.0000 mg | ORAL_TABLET | Freq: Once | ORAL | Status: DC

## 2015-01-08 NOTE — Telephone Encounter (Signed)
Return message via mychart

## 2015-01-08 NOTE — Telephone Encounter (Signed)
Pt wants courtney to return her call, pt said she also sent her a message in [place]

## 2015-01-10 ENCOUNTER — Other Ambulatory Visit: Payer: Self-pay

## 2015-01-10 ENCOUNTER — Encounter: Payer: Self-pay | Admitting: Family Medicine

## 2015-01-10 MED ORDER — METRONIDAZOLE 500 MG PO TABS: 500.0000 mg | ORAL_TABLET | Freq: Two times a day (BID) | ORAL | Status: DC

## 2015-02-06 ENCOUNTER — Telehealth: Payer: Self-pay

## 2015-02-06 NOTE — Telephone Encounter (Signed)
Letter composed and given to patient.

## 2015-02-06 NOTE — Telephone Encounter (Signed)
Letter may be typed that patient is in good health, ansd able to work as [employer]

## 2015-03-25 ENCOUNTER — Ambulatory Visit: Payer: 59 | Admitting: Family Medicine

## 2015-05-13 ENCOUNTER — Encounter: Payer: 59 | Admitting: Family Medicine

## 2015-09-12 ENCOUNTER — Other Ambulatory Visit: Payer: Self-pay | Admitting: Family Medicine

## 2015-11-24 ENCOUNTER — Ambulatory Visit (INDEPENDENT_AMBULATORY_CARE_PROVIDER_SITE_OTHER): Payer: 59 | Admitting: Family Medicine

## 2015-11-24 ENCOUNTER — Other Ambulatory Visit (HOSPITAL_COMMUNITY)
Admission: RE | Admit: 2015-11-24 | Discharge: 2015-11-24 | Disposition: A | Discharge status: Home / Self Care | Payer: 59 | Source: Ambulatory Visit | Attending: Family Medicine | Admitting: Family Medicine

## 2015-11-24 ENCOUNTER — Encounter: Payer: Self-pay | Admitting: Family Medicine

## 2015-11-24 VITALS — BP 148/86 | HR 82 | Resp 16 | Ht 63.0 in | Wt 143.0 lb

## 2015-11-24 DIAGNOSIS — N926 Irregular menstruation, unspecified: Secondary | ICD-10-CM

## 2015-11-24 DIAGNOSIS — Z01419 Encounter for gynecological examination (general) (routine) without abnormal findings: Secondary | ICD-10-CM | POA: Insufficient documentation

## 2015-11-24 DIAGNOSIS — Z0000 Encounter for general adult medical examination without abnormal findings: Secondary | ICD-10-CM | POA: Insufficient documentation

## 2015-11-24 DIAGNOSIS — I10 Essential (primary) hypertension: Secondary | ICD-10-CM | POA: Diagnosis not present

## 2015-11-24 DIAGNOSIS — Z1151 Encounter for screening for human papillomavirus (HPV): Secondary | ICD-10-CM | POA: Insufficient documentation

## 2015-11-24 DIAGNOSIS — N760 Acute vaginitis: Secondary | ICD-10-CM

## 2015-11-24 DIAGNOSIS — Z113 Encounter for screening for infections with a predominantly sexual mode of transmission: Secondary | ICD-10-CM | POA: Insufficient documentation

## 2015-11-24 DIAGNOSIS — Z124 Encounter for screening for malignant neoplasm of cervix: Secondary | ICD-10-CM

## 2015-11-24 MED ORDER — NORETHIN-ETH ESTRAD TRIPHASIC 0.5/0.75/1-35 MG-MCG PO TABS: 1.0000 | ORAL_TABLET | Freq: Every day | ORAL | Status: DC

## 2015-11-24 MED ORDER — AMLODIPINE BESYLATE 10 MG PO TABS: 10.0000 mg | ORAL_TABLET | Freq: Every day | ORAL | Status: DC

## 2015-11-24 NOTE — Progress Notes (Signed)
   Subjective:    Patient ID: [name], female    DOB: December 20, 1980, 35 y.o.   MRN: [id number]  HPI Patient is in for annual physical exam. C/o increased vaginal d/c in past 2 weeks, no odor , burn or itch, no dyspareunia. Recent labs, if available are reviewed. Immunization is reviewed , and  updated if needed.    Review of Systems See HPI     Objective:   Physical Exam BP 148/86 mmHg  Pulse 82  Resp 16  Ht 5\' 3"  (1.6 m)  Wt 143 lb (64.864 kg)  BMI 25.34 kg/m2  SpO2 100%  LMP 11/10/2015 (Exact Date) Pleasant well nourished female, alert and oriented x 3, in no cardio-pulmonary distress. Afebrile. HEENT No facial trauma or asymetry. Sinuses non tender.  Extra occullar muscles intact, pupils equally reactive to light. External ears normal, tympanic membranes clear. Oropharynx moist, no exudate, good dentition. Neck: supple, no adenopathy,JVD or thyromegaly.No bruits.  Chest: Clear to ascultation bilaterally.No crackles or wheezes. Non tender to palpation  Breast: No asymetry,no masses or lumps. No tenderness. No nipple discharge or inversion. No axillary or supraclavicular adenopathy  Cardiovascular system; Heart sounds normal,  S1 and  S2 ,no S3.  No murmur, or thrill. Apical beat not displaced Peripheral pulses normal.  Abdomen: Soft, non tender, no organomegaly or masses. No bruits. Bowel sounds normal. No guarding, tenderness or rebound.     GU: External genitalia normal female genitalia , female distribution of hair. No lesions. Urethral meatus normal in size, no  Prolapse, no lesions visibly  Present. Bladder non tender. Vagina pink and moist , with no visible lesions ,white malodorous  discharge present . Adequate pelvic support no  cystocele or rectocele noted Cervix pink and appears healthy, no lesions or ulcerations noted Uterus normal size, no adnexal masses, no cervical motion or adnexal tenderness.   Musculoskeletal exam: Full ROM  of spine, hips , shoulders and knees. No deformity ,swelling or crepitus noted. No muscle wasting or atrophy.   Neurologic: Cranial nerves 2 to 12 intact. Power, tone ,sensation and reflexes normal throughout. No disturbance in gait. No tremor.  Skin: Intact, no ulceration, erythema , scaling or rash noted. Pigmentation normal throughout  Psych; Normal mood and affect. Judgement and concentration normal        Assessment & Plan:  Annual physical exam Annual exam as documented. Counseling done  re healthy lifestyle involving commitment to 150 minutes exercise per week, heart healthy diet, and attaining healthy weight.The importance of adequate sleep also discussed. Regular seat belt use and home safety, is also discussed. Changes in health habits are decided on by the patient with goals and time frames  set for achieving them. Immunization and cancer screening needs are specifically addressed at this visit.   HTN (hypertension) Uncontrolled  Inc amlodipine dose DASH diet and commitment to daily physical activity for a minimum of 30 minutes discussed and encouraged, as a part of hypertension management. The importance of attaining a healthy weight is also discussed.  BP/Weight 11/24/2015 01/05/2015 12/02/2014 11/07/2014 09/10/2014 05/23/2014 123456  Systolic BP 123456 [id number] [id number]  Diastolic BP 86 76 80 80 80 74 80  Wt. (Lbs) 143 - 137 138.12 141.08 142 141  BMI 25.34 - 24.27 24.47 25 25.16 24.98        Prolonged periods Trial of alternate contraceptive, may require gyne eval  Vaginitis and vulvovaginitis Symptomatic with increased d/c specimens sent for testing

## 2015-11-24 NOTE — Patient Instructions (Signed)
F/u in 2  To 3 months, call if you need me before  Increase amlodipine dose to 10 mg daily, OK to take TWO 5 mg tabs together every day till done  New contrceptive pill sent , if you continue flood/ clot  a lot , I will refer you at next visit to gyne  Please work on good  health habits so that your health will improve. 1. Commitment to daily physical activity for 30 to 60  minutes, if you are able to do this.  2. Commitment to wise food choices. Aim for half of your  food intake to be vegetable and fruit, one quarter starchy foods, and one quarter protein. Try to eat on a regular schedule  3 meals per day, snacking between meals should be limited to vegetables or fruits or small portions of nuts. 64 ounces of water per day is generally recommended, unless you have specific health conditions, like heart failure or kidney failure where you will need to limit fluid intake.  3. Commitment to sufficient and a  good quality of physical and mental rest daily, generally between 6 to 8 hours per day.  WITH PERSISTANCE AND PERSEVERANCE, THE IMPOSSIBLE , BECOMES THE NORM! Thank you  for choosing [facility]. We consider it a privelige to serve you.  Delivering excellent health care in a caring and  compassionate way is our goal.  Partnering with you,  so that together we can achieve this goal is our strategy.

## 2015-11-24 NOTE — Assessment & Plan Note (Signed)
Symptomatic with increased d/c specimens sent for testing

## 2015-11-24 NOTE — Assessment & Plan Note (Signed)

## 2015-11-24 NOTE — Assessment & Plan Note (Addendum)
Uncontrolled  Inc amlodipine dose DASH diet and commitment to daily physical activity for a minimum of 30 minutes discussed and encouraged, as a part of hypertension management. The importance of attaining a healthy weight is also discussed.  BP/Weight 11/24/2015 01/05/2015 12/02/2014 11/07/2014 09/10/2014 05/23/2014 [id number]  Systolic BP 123456 [id number]  Diastolic BP 86 76 80 80 80 74 80  Wt. (Lbs) 143 - 137 138.12 141.08 142 141  BMI 25.34 - 24.27 24.47 25 25.16 24.98

## 2015-11-24 NOTE — Assessment & Plan Note (Signed)
Trial of alternate contraceptive, may require gyne eval

## 2015-11-26 LAB — CERVICOVAGINAL ANCILLARY ONLY: Wet Prep (BD Affirm): NEGATIVE

## 2015-11-26 LAB — CYTOLOGY - PAP

## 2015-12-01 ENCOUNTER — Encounter: Payer: Self-pay | Admitting: Family Medicine

## 2015-12-01 ENCOUNTER — Other Ambulatory Visit: Payer: Self-pay | Admitting: Family Medicine

## 2015-12-01 DIAGNOSIS — D539 Nutritional anemia, unspecified: Secondary | ICD-10-CM

## 2015-12-01 LAB — TSH: TSH: 0.79 mIU/L

## 2015-12-01 LAB — COMPREHENSIVE METABOLIC PANEL
ALBUMIN: 4.3 g/dL (ref 3.6–5.1)
ALK PHOS: 61 U/L (ref 33–115)
ALT: 15 U/L (ref 6–29)
AST: 19 U/L (ref 10–30)
BILIRUBIN TOTAL: 0.4 mg/dL (ref 0.2–1.2)
BUN: 17 mg/dL (ref 7–25)
CO2: 24 mmol/L (ref 20–31)
CREATININE: 0.71 mg/dL (ref 0.50–1.10)
Calcium: 8.8 mg/dL (ref 8.6–10.2)
Chloride: 104 mmol/L (ref 98–110)
Glucose, Bld: 90 mg/dL (ref 65–99)
Potassium: 3.7 mmol/L (ref 3.5–5.3)
SODIUM: 138 mmol/L (ref 135–146)
TOTAL PROTEIN: 7.5 g/dL (ref 6.1–8.1)

## 2015-12-01 LAB — CBC
HCT: 33 % — ABNORMAL LOW (ref 35.0–45.0)
Hemoglobin: 10.7 g/dL — ABNORMAL LOW (ref 11.7–15.5)
MCH: 24.8 pg — ABNORMAL LOW (ref 27.0–33.0)
MCHC: 32.4 g/dL (ref 32.0–36.0)
MCV: 76.6 fL — ABNORMAL LOW (ref 80.0–100.0)
MPV: 10.1 fL (ref 7.5–12.5)
PLATELETS: 308 10*3/uL (ref 140–400)
RBC: 4.31 MIL/uL (ref 3.80–5.10)
RDW: 15.7 % — ABNORMAL HIGH (ref 11.0–15.0)
WBC: 7.7 10*3/uL (ref 3.8–10.8)

## 2015-12-01 LAB — LIPID PANEL
CHOLESTEROL: 184 mg/dL (ref 125–200)
HDL: 57 mg/dL (ref >=46)
LDL Cholesterol: 117 mg/dL (ref <130)
Total CHOL/HDL Ratio: 3.2 Ratio (ref <=5.0)
Triglycerides: 49 mg/dL (ref <150)
VLDL: 10 mg/dL (ref <30)

## 2015-12-02 LAB — IRON,TIBC AND FERRITIN PANEL
%SAT: 19 % (ref 11–50)
Ferritin: 11 ng/mL (ref 10–154)
Iron: 77 ug/dL (ref 40–190)
TIBC: 402 ug/dL (ref 250–450)

## 2015-12-03 NOTE — Addendum Note (Signed)
Addended by: Eual Fines on: 12/03/2015 09:08 AM   Modules accepted: Orders

## 2015-12-04 ENCOUNTER — Telehealth: Payer: Self-pay | Admitting: Family Medicine

## 2015-12-04 NOTE — Telephone Encounter (Signed)
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

## 2015-12-15 ENCOUNTER — Other Ambulatory Visit: Payer: Self-pay | Admitting: Family Medicine

## 2016-02-13 ENCOUNTER — Other Ambulatory Visit: Payer: Self-pay | Admitting: Family Medicine

## 2016-02-13 ENCOUNTER — Encounter: Payer: Self-pay | Admitting: Family Medicine

## 2016-02-13 LAB — CBC
HCT: 30.6 % — ABNORMAL LOW (ref 35.0–45.0)
HEMOGLOBIN: 9.7 g/dL — AB (ref 11.7–15.5)
MCH: 24.1 pg — AB (ref 27.0–33.0)
MCHC: 31.7 g/dL — ABNORMAL LOW (ref 32.0–36.0)
MCV: 76.1 fL — AB (ref 80.0–100.0)
MPV: 10 fL (ref 7.5–12.5)
Platelets: 248 10*3/uL (ref 140–400)
RBC: 4.02 MIL/uL (ref 3.80–5.10)
RDW: 16.1 % — ABNORMAL HIGH (ref 11.0–15.0)
WBC: 5.7 10*3/uL (ref 3.8–10.8)

## 2016-02-13 LAB — HIV ANTIBODY (ROUTINE TESTING W REFLEX): HIV 1&2 Ab, 4th Generation: NONREACTIVE

## 2016-02-13 LAB — FERRITIN: Ferritin: 8 ng/mL — ABNORMAL LOW (ref 10–154)

## 2016-02-13 LAB — IRON: Iron: 44 ug/dL (ref 40–190)

## 2016-03-03 ENCOUNTER — Encounter: Payer: Self-pay | Admitting: Family Medicine

## 2016-03-05 ENCOUNTER — Ambulatory Visit: Payer: 59 | Admitting: Family Medicine

## 2016-03-08 ENCOUNTER — Ambulatory Visit: Payer: 59 | Admitting: Family Medicine

## 2016-03-17 ENCOUNTER — Other Ambulatory Visit: Payer: Self-pay | Admitting: Family Medicine

## 2016-05-19 ENCOUNTER — Ambulatory Visit: Payer: 59 | Admitting: Family Medicine

## 2016-06-04 ENCOUNTER — Encounter: Payer: Self-pay | Admitting: Family Medicine

## 2016-06-04 ENCOUNTER — Ambulatory Visit (INDEPENDENT_AMBULATORY_CARE_PROVIDER_SITE_OTHER): Payer: 59 | Admitting: Family Medicine

## 2016-06-04 VITALS — BP 110/78 | HR 87 | Temp 98.9°F | Resp 16 | Ht 63.0 in | Wt 144.0 lb

## 2016-06-04 DIAGNOSIS — N920 Excessive and frequent menstruation with regular cycle: Secondary | ICD-10-CM

## 2016-06-04 DIAGNOSIS — I10 Essential (primary) hypertension: Secondary | ICD-10-CM

## 2016-06-04 DIAGNOSIS — J209 Acute bronchitis, unspecified: Secondary | ICD-10-CM

## 2016-06-04 DIAGNOSIS — J0140 Acute pansinusitis, unspecified: Secondary | ICD-10-CM

## 2016-06-04 DIAGNOSIS — N926 Irregular menstruation, unspecified: Secondary | ICD-10-CM

## 2016-06-04 DIAGNOSIS — D508 Other iron deficiency anemias: Secondary | ICD-10-CM

## 2016-06-04 LAB — IRON: Iron: 18 ug/dL — ABNORMAL LOW (ref 40–190)

## 2016-06-04 MED ORDER — LEVOFLOXACIN 500 MG PO TABS: 500.0000 mg | ORAL_TABLET | Freq: Every day | ORAL | 0 refills | Status: DC

## 2016-06-04 MED ORDER — FLUCONAZOLE 150 MG PO TABS: ORAL_TABLET | ORAL | 0 refills | Status: DC

## 2016-06-04 NOTE — Assessment & Plan Note (Addendum)
Reports 6  Day cycles despite oCP with flooding and clotting for 3 days, refer to gyne for management

## 2016-06-04 NOTE — Patient Instructions (Signed)
Physical exam June 20 or after, call if you need me sooner  CBc, iron , ferritin and anemia panel today  You are referred to gyne of your xchoice re clotting and flooding, 3 out of 6 days  You are treated for acute sinusitis , levaquin and fluconazole are sent in

## 2016-06-04 NOTE — Assessment & Plan Note (Addendum)
4 day h/o symptoms, levaquin and netty pot , 2 tylenol tabs given at visit for headache

## 2016-06-04 NOTE — Assessment & Plan Note (Signed)
Controlled, no change in medication  

## 2016-06-04 NOTE — Progress Notes (Signed)
   [name]     MRN: [id number]      DOB: 19-May-1981   HPI [name]  4 day h/o worsening head and chest congestion, associated with fever and chills intermittently. Nasal drainage has thickened , and is yellowish green, and at times bloody. Sputum is thick and yellow. C/o bilateral ear pressure, denies hearing loss and sore throat. Increasing fatigue , poor appetitie and sleep disturbed by cough. No improvement with OTC medication.   ROS  Denies chest pains, palpitations and leg swelling Denies abdominal pain, nausea, vomiting,diarrhea or constipation.   Denies dysuria, frequency, hesitancy or incontinence. Denies joint pain, swelling and limitation in mobility. Denies headaches, seizures, numbness, or tingling. Denies depression, anxiety or insomnia. Denies skin break down or rash. No work since12 /27, already off   PE  BP 110/78   Pulse 87   Temp 98.9 F (37.2 C) (Oral)   Resp 16   Ht 5\' 3"  (1.6 m)   Wt 144 lb (65.3 kg)   SpO2 99%   BMI 25.51 kg/m   Patient alert and oriented and in no cardiopulmonary distress.Ill appearing  HEENT: No facial asymmetry, EOMI,   oropharynx pink and moist.  Neck supple no JVD, no mass.Frontal and maxillary sinus tenderness, TM erythematioous  Chest: adequate air entry bilaterally scattered wheezes and crackles  CVS: S1, S2 no murmurs, no S3.Regular rate.  ABD: Soft non tender.   Ext: No edema  MS: Adequate ROM spine, shoulders, hips and knees.  Skin: Intact, no ulcerations or rash noted.  Psych: Good eye contact, normal affect. Memory intact not anxious or depressed appearing.  CNS: CN 2-12 intact, power,  normal throughout.no focal deficits noted.   Assessment & Plan  Acute sinusitis 4 day h/o symptoms, levaquin and netty pot , 2 tylenol tabs given at visit for headache  Acute bronchitis 4 day h/o worsening symptoms, levaquin prescribed  5 day prednison top reduce drainage   HTN (hypertension) Controlled,  no change in medication   Prolonged periods Reports 6  Day cycles despite oCP with flooding and clotting for 3 days, refer to gyne for management

## 2016-06-04 NOTE — Assessment & Plan Note (Signed)
4 day h/o worsening symptoms, levaquin prescribed  5 day prednison top reduce drainage

## 2016-06-05 LAB — VITAMIN B12: Vitamin B-12: 461 pg/mL (ref 200–1100)

## 2016-06-05 LAB — FERRITIN: Ferritin: 6 ng/mL — ABNORMAL LOW (ref 10–154)

## 2016-06-05 LAB — FOLATE: Folate: 6.7 ng/mL (ref >5.4)

## 2016-06-07 LAB — CBC WITH DIFFERENTIAL/PLATELET

## 2016-06-08 ENCOUNTER — Other Ambulatory Visit: Payer: Self-pay | Admitting: Family Medicine

## 2016-06-08 MED ORDER — FERROUS SULFATE 325 (65 FE) MG PO TBEC: DELAYED_RELEASE_TABLET | ORAL | 3 refills | Status: AC

## 2016-06-29 ENCOUNTER — Other Ambulatory Visit: Payer: Self-pay | Admitting: Family Medicine

## 2016-06-29 ENCOUNTER — Other Ambulatory Visit: Payer: Self-pay

## 2016-06-29 DIAGNOSIS — I10 Essential (primary) hypertension: Secondary | ICD-10-CM

## 2016-06-29 MED ORDER — AMLODIPINE BESYLATE 10 MG PO TABS: 10.0000 mg | ORAL_TABLET | Freq: Every day | ORAL | 1 refills | Status: DC

## 2016-07-19 ENCOUNTER — Telehealth: Payer: Self-pay | Admitting: Family Medicine

## 2016-07-19 DIAGNOSIS — J3489 Other specified disorders of nose and nasal sinuses: Secondary | ICD-10-CM

## 2016-07-19 NOTE — Telephone Encounter (Signed)
Patient states that drainage is nasal.   Unable to produce sputum by coughing.   Xray entered for patient to have done at [facility].

## 2016-07-19 NOTE — Telephone Encounter (Signed)
Patient reports no fever.  Will find out if she would like to submit sputum or have imaging.  Patient is currently at work.  Will leave message for her to return call.

## 2016-07-19 NOTE — Telephone Encounter (Signed)
Message left for patient to return call.

## 2016-07-19 NOTE — Telephone Encounter (Signed)
She was seen 12/29, I recommend she submit sputum for testing for c/s, also if coughing up sputum needs CXR, if drainage from sinuses needs sinus x ray Does she report fever?  Let me know will work in this week if needed, BUT need basic data today Pls get  Back to me with f/u info

## 2016-07-19 NOTE — Telephone Encounter (Signed)
[name] is stating that she was given antibiotics a week ago for a sinus infection and she states now she still has green mucos and and lump under her chin and on the back of her neck, please advise?

## 2016-07-19 NOTE — Telephone Encounter (Signed)
Patient states that she completed abt that was prescribed when she was last here but continues to have green sputum and says that lymph nodes are swollen.  Please advise.

## 2016-07-23 ENCOUNTER — Encounter: Payer: Self-pay | Admitting: Family Medicine

## 2016-07-23 ENCOUNTER — Ambulatory Visit (HOSPITAL_COMMUNITY)
Admission: RE | Admit: 2016-07-23 | Discharge: 2016-07-23 | Disposition: A | Discharge status: Home / Self Care | Payer: BLUE CROSS/BLUE SHIELD | Source: Ambulatory Visit | Attending: Family Medicine | Admitting: Family Medicine

## 2016-07-23 DIAGNOSIS — J3489 Other specified disorders of nose and nasal sinuses: Secondary | ICD-10-CM | POA: Insufficient documentation

## 2016-11-30 ENCOUNTER — Encounter: Payer: 59 | Admitting: Family Medicine

## 2016-12-02 ENCOUNTER — Encounter: Payer: 59 | Admitting: Family Medicine

## 2016-12-04 ENCOUNTER — Other Ambulatory Visit: Payer: Self-pay | Admitting: Family Medicine

## 2016-12-04 DIAGNOSIS — I10 Essential (primary) hypertension: Secondary | ICD-10-CM

## 2016-12-06 NOTE — Telephone Encounter (Signed)
Seen 12 19 17

## 2017-05-03 ENCOUNTER — Encounter: Payer: Self-pay | Admitting: Family Medicine

## 2017-05-03 ENCOUNTER — Ambulatory Visit (INDEPENDENT_AMBULATORY_CARE_PROVIDER_SITE_OTHER): Payer: Commercial Managed Care - PPO | Admitting: Family Medicine

## 2017-05-03 VITALS — BP 134/84 | HR 78 | Resp 16 | Ht 63.0 in | Wt 142.0 lb

## 2017-05-03 DIAGNOSIS — E559 Vitamin D deficiency, unspecified: Secondary | ICD-10-CM

## 2017-05-03 DIAGNOSIS — I10 Essential (primary) hypertension: Secondary | ICD-10-CM | POA: Diagnosis not present

## 2017-05-03 DIAGNOSIS — B977 Papillomavirus as the cause of diseases classified elsewhere: Secondary | ICD-10-CM

## 2017-05-03 DIAGNOSIS — Z2821 Immunization not carried out because of patient refusal: Secondary | ICD-10-CM | POA: Diagnosis not present

## 2017-05-03 DIAGNOSIS — L723 Sebaceous cyst: Secondary | ICD-10-CM

## 2017-05-03 DIAGNOSIS — N72 Inflammatory disease of cervix uteri: Secondary | ICD-10-CM | POA: Diagnosis not present

## 2017-05-03 DIAGNOSIS — D500 Iron deficiency anemia secondary to blood loss (chronic): Secondary | ICD-10-CM | POA: Diagnosis not present

## 2017-05-03 DIAGNOSIS — L089 Local infection of the skin and subcutaneous tissue, unspecified: Secondary | ICD-10-CM

## 2017-05-03 DIAGNOSIS — N926 Irregular menstruation, unspecified: Secondary | ICD-10-CM | POA: Diagnosis not present

## 2017-05-03 MED ORDER — MUPIROCIN 2 % EX OINT: TOPICAL_OINTMENT | CUTANEOUS | 1 refills | Status: DC

## 2017-05-03 MED ORDER — AMLODIPINE BESYLATE 10 MG PO TABS: 10.0000 mg | ORAL_TABLET | Freq: Every day | ORAL | 3 refills | Status: DC

## 2017-05-03 NOTE — Patient Instructions (Addendum)
F/U I  1 year, call if you need me before  No change in medication.for blood pressure  Bactroban ointment sent for use if you start getting skin infections  It is important that you exercise regularly at least 30 minutes 5 times a week. If you develop chest pain, have severe difficulty breathing, or feel very tired, stop exercising immediately and seek medical attention    Thank you  for choosing [facility]. We consider it a privelige to serve you.  Delivering excellent health care in a caring and  compassionate way is our goal.  Partnering with you,  so that together we can achieve this goal is our strategy.      CBC, iron  And ferritin , fasting lipid, chem 7 ,  ( [facility])

## 2017-05-07 DIAGNOSIS — B977 Papillomavirus as the cause of diseases classified elsewhere: Secondary | ICD-10-CM

## 2017-05-07 HISTORY — DX: Papillomavirus as the cause of diseases classified elsewhere: B97.7

## 2017-05-07 LAB — CBC
HEMATOCRIT: 30.7 % — AB (ref 34.0–46.6)
HEMOGLOBIN: 10.1 g/dL — AB (ref 11.1–15.9)
MCH: 25.3 pg — AB (ref 26.6–33.0)
MCHC: 32.9 g/dL (ref 31.5–35.7)
MCV: 77 fL — AB (ref 79–97)
Platelets: 306 10*3/uL (ref 150–379)
RBC: 4 x10E6/uL (ref 3.77–5.28)
RDW: 15.8 % — ABNORMAL HIGH (ref 12.3–15.4)
WBC: 9.5 10*3/uL (ref 3.4–10.8)

## 2017-05-07 LAB — LIPID PANEL
CHOLESTEROL TOTAL: 158 mg/dL (ref 100–199)
Chol/HDL Ratio: 3.1 ratio (ref 0.0–4.4)
HDL: 51 mg/dL (ref >39)
LDL Calculated: 93 mg/dL (ref 0–99)
Triglycerides: 68 mg/dL (ref 0–149)
VLDL CHOLESTEROL CAL: 14 mg/dL (ref 5–40)

## 2017-05-07 LAB — BASIC METABOLIC PANEL
BUN/Creatinine Ratio: 12 (ref 9–23)
BUN: 10 mg/dL (ref 6–20)
CALCIUM: 9.1 mg/dL (ref 8.7–10.2)
CO2: 23 mmol/L (ref 20–29)
Chloride: 104 mmol/L (ref 96–106)
Creatinine, Ser: 0.81 mg/dL (ref 0.57–1.00)
GFR calc non Af Amer: 94 mL/min/{1.73_m2} (ref >59)
GFR, EST AFRICAN AMERICAN: 108 mL/min/{1.73_m2} (ref >59)
GLUCOSE: 92 mg/dL (ref 65–99)
Potassium: 4.2 mmol/L (ref 3.5–5.2)
Sodium: 140 mmol/L (ref 134–144)

## 2017-05-07 LAB — FERRITIN: Ferritin: 9 ng/mL — ABNORMAL LOW (ref 15–150)

## 2017-05-07 LAB — IRON: Iron: 13 ug/dL — ABNORMAL LOW (ref 27–159)

## 2017-05-08 ENCOUNTER — Encounter: Payer: Self-pay | Admitting: Family Medicine

## 2017-05-08 DIAGNOSIS — L089 Local infection of the skin and subcutaneous tissue, unspecified: Secondary | ICD-10-CM | POA: Insufficient documentation

## 2017-05-08 DIAGNOSIS — N72 Inflammatory disease of cervix uteri: Secondary | ICD-10-CM

## 2017-05-08 DIAGNOSIS — L723 Sebaceous cyst: Secondary | ICD-10-CM

## 2017-05-08 DIAGNOSIS — Z2821 Immunization not carried out because of patient refusal: Secondary | ICD-10-CM | POA: Insufficient documentation

## 2017-05-08 DIAGNOSIS — B977 Papillomavirus as the cause of diseases classified elsewhere: Secondary | ICD-10-CM | POA: Insufficient documentation

## 2017-05-08 HISTORY — DX: Sebaceous cyst: L72.3

## 2017-05-08 NOTE — Assessment & Plan Note (Signed)
States she has colposcopy planned for December , 2018

## 2017-05-08 NOTE — Assessment & Plan Note (Signed)
Deteriorated , ongoing heavy cycles, updated labs are  Reviewed . She is being advised to address the bleeding issue as well as to supplement with OTC iron , rept lab in early March

## 2017-05-08 NOTE — Assessment & Plan Note (Signed)
Early infection noted in left axilla. Still shaving. Advised to stop this. Script provided for Bactroban for as needed use

## 2017-05-08 NOTE — Assessment & Plan Note (Signed)
Updated lab needed.  

## 2017-05-08 NOTE — Assessment & Plan Note (Signed)
Controlled, no change in medication DASH diet and commitment to daily physical activity for a minimum of 30 minutes discussed and encouraged, as a part of hypertension management. The importance of attaining a healthy weight is also discussed.  BP/Weight 05/03/2017 06/04/2016 11/24/2015 01/05/2015 12/02/2014 12/07/5668 06/10/1028  Systolic BP 131 438 887 579 728 206 015  Diastolic BP 84 78 86 76 80 80 80  Wt. (Lbs) 142 144 143 - 137 138.12 141.08  BMI 25.15 25.51 25.34 - 24.27 24.47 25

## 2017-05-08 NOTE — Progress Notes (Signed)
   [name]     MRN: [id number]      DOB: [birth year]   HPI [name] is here for follow up and re-evaluation of chronic medical conditions, medication management and review of any available recent lab and radiology data.  Preventive health is updated, specifically  Cancer screening and Immunization.  Refuses flu vaccine, has colposcopy planned next month Questions or concerns regarding consultations or procedures which the PT has had in the interim are  addressed. The PT denies any adverse reactions to current medications since the last visit.  C/o boil starting in left axilla, still shaving  ROS Denies recent fever or chills. Denies sinus pressure, nasal congestion, ear pain or sore throat. Denies chest congestion, productive cough or wheezing. Denies chest pains, palpitations and leg swelling Denies abdominal pain, nausea, vomiting,diarrhea or constipation.   Denies dysuria, frequency, hesitancy or incontinence. Denies joint pain, swelling and limitation in mobility. Denies headaches, seizures, numbness, or tingling. Denies depression, anxiety or insomnia.    PE  BP 134/84   Pulse 78   Resp 16   Ht 5\' 3"  (1.6 m)   Wt 142 lb (64.4 kg)   SpO2 99%   BMI 25.15 kg/m   Patient alert and oriented and in no cardiopulmonary distress.  HEENT: No facial asymmetry, EOMI,   oropharynx pink and moist.  Neck supple no JVD, no mass.  Chest: Clear to auscultation bilaterally.  CVS: S1, S2 no murmurs, no S3.Regular rate.  ABD: Soft non tender.   Ext: No edema  MS: Adequate ROM spine, shoulders, hips and knees.  Skin: Intact,tender , warm area in left axilla, no drainage , no abcess  Psych: Good eye contact, normal affect. Memory intact not anxious or depressed appearing.  CNS: CN 2-12 intact, power,  normal throughout.no focal deficits noted.   Assessment & Plan  HTN (hypertension) Controlled, no change in medication DASH diet and commitment to daily physical  activity for a minimum of 30 minutes discussed and encouraged, as a part of hypertension management. The importance of attaining a healthy weight is also discussed.  BP/Weight 05/03/2017 06/04/2016 11/24/2015 01/05/2015 12/02/2014 11/07/9474 10/08/6501  Systolic BP 546 568 127 517 001 749 449  Diastolic BP 84 78 86 76 80 80 80  Wt. (Lbs) 142 144 143 - 137 138.12 141.08  BMI 25.15 25.51 25.34 - 24.27 24.47 25       Prolonged periods Continued problem being managed by gyne, currently considering hysterectomy, I also mentioned ablation, she will discuss furthers with gyne  IDA (iron deficiency anemia) Deteriorated , ongoing heavy cycles, updated labs are  Reviewed . She is being advised to address the bleeding issue as well as to supplement with OTC iron , rept lab in early March  High risk human papilloma virus (HPV) infection of cervix States she has colposcopy planned for December , 2018  Vitamin d deficiency Updated lab needed   Infected sebaceous cyst of skin Early infection noted in left axilla. Still shaving. Advised to stop this. Script provided for Bactroban for as needed use  Influenza vaccine refused Refuses flu vaccine at visit and states she does not give this to her young children either

## 2017-05-08 NOTE — Assessment & Plan Note (Signed)
Continued problem being managed by gyne, currently considering hysterectomy, I also mentioned ablation, she will discuss furthers with gyne

## 2017-05-08 NOTE — Assessment & Plan Note (Signed)
Refuses flu vaccine at visit and states she does not give this to her young children either

## 2017-05-12 ENCOUNTER — Other Ambulatory Visit: Payer: Self-pay | Admitting: Family Medicine

## 2017-05-12 ENCOUNTER — Telehealth: Payer: Self-pay | Admitting: Family Medicine

## 2017-05-12 ENCOUNTER — Encounter: Payer: Self-pay | Admitting: Family Medicine

## 2017-05-12 MED ORDER — ERGOCALCIFEROL 1.25 MG (50000 UT) PO CAPS: 50000.0000 [IU] | ORAL_CAPSULE | ORAL | 1 refills | Status: DC

## 2017-05-12 NOTE — Telephone Encounter (Signed)
Your vitamin D is very low and you need to commit to once weekly vitamin D , I have prescribed this for the next 12 weeks

## 2017-05-13 ENCOUNTER — Telehealth: Payer: Self-pay

## 2017-05-13 DIAGNOSIS — D509 Iron deficiency anemia, unspecified: Secondary | ICD-10-CM

## 2017-05-13 DIAGNOSIS — I10 Essential (primary) hypertension: Secondary | ICD-10-CM

## 2017-05-13 DIAGNOSIS — E559 Vitamin D deficiency, unspecified: Secondary | ICD-10-CM

## 2017-05-13 LAB — VITAMIN D 25 HYDROXY (VIT D DEFICIENCY, FRACTURES): Vit D, 25-Hydroxy: 8.1 ng/mL — ABNORMAL LOW (ref 30.0–100.0)

## 2017-05-13 LAB — SPECIMEN STATUS REPORT

## 2017-05-13 NOTE — Telephone Encounter (Signed)
-----   Message from Fayrene Helper, MD sent at 05/12/2017 10:00 PM EST ----- Regarding: labs needed end May CBC, iron and ferritin and vitamin d level Dx iDA and vot D def  Pls mail to he r,I have sent her an e mail msg t to contact you

## 2017-05-13 NOTE — Telephone Encounter (Signed)
Patient aware and labs faxed to dr bovard per request

## 2017-05-14 ENCOUNTER — Encounter: Payer: Self-pay | Admitting: Family Medicine

## 2017-05-16 ENCOUNTER — Ambulatory Visit: Payer: BLUE CROSS/BLUE SHIELD | Admitting: Family Medicine

## 2017-05-17 ENCOUNTER — Telehealth: Payer: Self-pay | Admitting: Family Medicine

## 2017-05-17 NOTE — Telephone Encounter (Signed)
Pt called advised that she has a tickle in her throat she cant get rid of, coughing up mucus yellow\greenish in color.  [phone] Can you call in something or does she need to be on the schedule

## 2017-05-18 ENCOUNTER — Encounter: Payer: Self-pay | Admitting: Family Medicine

## 2017-05-18 ENCOUNTER — Ambulatory Visit (INDEPENDENT_AMBULATORY_CARE_PROVIDER_SITE_OTHER): Payer: Commercial Managed Care - PPO | Admitting: Family Medicine

## 2017-05-18 VITALS — BP 130/80 | HR 113 | Temp 101.9°F | Resp 16 | Ht 63.0 in | Wt 144.1 lb

## 2017-05-18 DIAGNOSIS — J0140 Acute pansinusitis, unspecified: Secondary | ICD-10-CM | POA: Diagnosis not present

## 2017-05-18 DIAGNOSIS — J209 Acute bronchitis, unspecified: Secondary | ICD-10-CM | POA: Diagnosis not present

## 2017-05-18 DIAGNOSIS — I10 Essential (primary) hypertension: Secondary | ICD-10-CM | POA: Diagnosis not present

## 2017-05-18 MED ORDER — FLUCONAZOLE 150 MG PO TABS: ORAL_TABLET | ORAL | 0 refills | Status: DC

## 2017-05-18 MED ORDER — PROMETHAZINE-DM 6.25-15 MG/5ML PO SYRP: ORAL_SOLUTION | ORAL | 0 refills | Status: DC

## 2017-05-18 MED ORDER — LEVOFLOXACIN 500 MG PO TABS: 500.0000 mg | ORAL_TABLET | Freq: Every day | ORAL | 0 refills | Status: DC

## 2017-05-18 MED ORDER — BENZONATATE 100 MG PO CAPS: 100.0000 mg | ORAL_CAPSULE | Freq: Two times a day (BID) | ORAL | 0 refills | Status: DC | PRN

## 2017-05-18 NOTE — Progress Notes (Signed)
   [name]     MRN: [id number]      DOB: [birth year]   HPI [name]  3 day h/o worsening head and chest congestion, associated with fever and chills intermittently. Nasal drainage has thickened , and is yellowish green, and at times bloody. Sputum is thick and yellow. C/o bilateral ear pressure, denies hearing loss and sore throat. Increasing fatigue , poor appetitie and sleep disturbed by cough. No improvement with OTC medication.   ROS See HPI   Denies PND, orthopnea, palpitations and leg swelling Denies abdominal pain, nausea, vomiting,diarrhea or constipation.   Denies dysuria, frequency, hesitancy or incontinence. Denies joint pain, swelling and limitation in mobility. Denies headaches, seizures, numbness, or tingling. Denies depression, anxiety or insomnia. Denies skin break down or rash.   PE  BP 130/80   Pulse (!) 113   Temp (!) 101.9 F (38.8 C) (Oral)   Resp 16   Ht 5\' 3"  (1.6 m)   Wt 144 lb 1.9 oz (65.4 kg)   SpO2 97%   BMI 25.53 kg/m   Patient alert and oriented and in no cardiopulmonary distress.  HEENT: No facial asymmetry, EOMI,   oropharynx pink and moist.  Neck supple no JVD, no mass.Maxillary sinus tenderness,  TM clear bilaterally, bilateral anterior cervical adenitis  Chest: Adequate air entry, bilateral crackles, no wheezes  CVS: S1, S2 no murmurs, no S3.Regular rate.  ABD: Soft non tender.   Ext: No edema  MS: Adequate ROM spine, shoulders, hips and knees.  Skin: Intact, no ulcerations or rash noted.  Psych: Good eye contact, normal affect. Memory intact not anxious or depressed appearing.  CNS: CN 2-12 intact, power,  normal throughout.no focal deficits noted.   Assessment & Plan  Acute bronchitis Antibiotic and decongestant prescribed and work excuse provided  Acute sinusitis Antibiotic and saline flushes twice daily and rest  HTN (hypertension) Controlled, no change in medication DASH diet and commitment to daily  physical activity for a minimum of 30 minutes discussed and encouraged, as a part of hypertension management. The importance of attaining a healthy weight is also discussed.  BP/Weight 05/18/2017 05/03/2017 06/04/2016 11/24/2015 01/05/2015 1/32/4401 0/07/7251  Systolic BP 664 403 474 259 563 875 643  Diastolic BP 80 84 78 86 76 80 80  Wt. (Lbs) 144.12 142 144 143 - 137 138.12  BMI 25.53 25.15 25.51 25.34 - 24.27 24.47

## 2017-05-18 NOTE — Telephone Encounter (Signed)
Patient scheduled for this, pm

## 2017-05-18 NOTE — Patient Instructions (Addendum)
F/u as before, call if you need me sooner  You are treated for acute sinusitis and bronchitis, take entire medication course  Work excuse from 12/13 to return 05/23/2017      Acute Bronchitis, Adult Acute bronchitis is when air tubes (bronchi) in the lungs suddenly get swollen. The condition can make it hard to breathe. It can also cause these symptoms:  A cough.  Coughing up clear, yellow, or green mucus.  Wheezing.  Chest congestion.  Shortness of breath.  A fever.  Body aches.  Chills.  A sore throat.  Follow these instructions at home: Medicines  Take over-the-counter and prescription medicines only as told by your doctor.  If you were prescribed an antibiotic medicine, take it as told by your doctor. Do not stop taking the antibiotic even if you start to feel better. General instructions  Rest.  Drink enough fluids to keep your pee (urine) clear or pale yellow.  Avoid smoking and secondhand smoke. If you smoke and you need help quitting, ask your doctor. Quitting will help your lungs heal faster.  Use an inhaler, cool mist vaporizer, or humidifier as told by your doctor.  Keep all follow-up visits as told by your doctor. This is important. How is this prevented? To lower your risk of getting this condition again:  Wash your hands often with soap and water. If you cannot use soap and water, use hand sanitizer.  Avoid contact with people who have cold symptoms.  Try not to touch your hands to your mouth, nose, or eyes.  Make sure to get the flu shot every year.  Contact a doctor if:  Your symptoms do not get better in 2 weeks. Get help right away if:  You cough up blood.  You have chest pain.  You have very bad shortness of breath.  You become dehydrated.  You faint (pass out) or keep feeling like you are going to pass out.  You keep throwing up (vomiting).  You have a very bad headache.  Your fever or chills gets worse. This  information is not intended to replace advice given to you by your health care provider. Make sure you discuss any questions you have with your health care provider. Document Released: 11/10/2007 Document Revised: 12/31/2015 Document Reviewed: 11/12/2015 Elsevier Interactive Patient Education  2017 [employer].

## 2017-05-23 NOTE — Assessment & Plan Note (Signed)
Controlled, no change in medication DASH diet and commitment to daily physical activity for a minimum of 30 minutes discussed and encouraged, as a part of hypertension management. The importance of attaining a healthy weight is also discussed.  BP/Weight 05/18/2017 05/03/2017 06/04/2016 11/24/2015 01/05/2015 8/75/7972 01/06/600  Systolic BP 561 537 943 276 147 092 957  Diastolic BP 80 84 78 86 76 80 80  Wt. (Lbs) 144.12 142 144 143 - 137 138.12  BMI 25.53 25.15 25.51 25.34 - 24.27 24.47

## 2017-05-23 NOTE — Assessment & Plan Note (Signed)
Antibiotic and saline flushes twice daily and rest

## 2017-05-23 NOTE — Assessment & Plan Note (Signed)
Antibiotic and decongestant prescribed and work excuse provided

## 2017-06-01 ENCOUNTER — Telehealth: Payer: Self-pay

## 2017-06-01 NOTE — Telephone Encounter (Signed)
Patient was given antibiotic and fluconazole and on Tuesday after her course she took the fluconazole and the next day her lips were swollen and nose and itchy. She seems fine now but has irritation around her mouth and her lips are scaly and itchy and she has scratched them so much they are sore. Can you recommend anything for her to use on them?

## 2017-06-01 NOTE — Telephone Encounter (Signed)
Can take an antihistamine like Claritin or Zyrtec, or Benadryl for the itching.  Nothing for the lips except for Vaseline or Chapstick products

## 2017-06-01 NOTE — Telephone Encounter (Signed)
Called [name], aware.

## 2017-06-08 ENCOUNTER — Encounter: Payer: Self-pay | Admitting: Family Medicine

## 2017-06-08 ENCOUNTER — Ambulatory Visit (INDEPENDENT_AMBULATORY_CARE_PROVIDER_SITE_OTHER): Payer: Commercial Managed Care - PPO | Admitting: Family Medicine

## 2017-06-08 VITALS — BP 120/80 | HR 101 | Resp 16 | Ht 63.0 in | Wt 144.1 lb

## 2017-06-08 DIAGNOSIS — I10 Essential (primary) hypertension: Secondary | ICD-10-CM | POA: Diagnosis not present

## 2017-06-08 DIAGNOSIS — E559 Vitamin D deficiency, unspecified: Secondary | ICD-10-CM

## 2017-06-08 DIAGNOSIS — T7840XA Allergy, unspecified, initial encounter: Secondary | ICD-10-CM

## 2017-06-08 DIAGNOSIS — D509 Iron deficiency anemia, unspecified: Secondary | ICD-10-CM | POA: Diagnosis not present

## 2017-06-08 DIAGNOSIS — N926 Irregular menstruation, unspecified: Secondary | ICD-10-CM

## 2017-06-08 NOTE — Patient Instructions (Signed)
F/U as  Before , call if you need me sooner  Start taking benadryl 2 tablets at bed timefor the next 7 to 10 days  You are being referred to an allergist for formal testing  Increase iron to twice daily   Eat greens daily  Labs in May as discussed  Best for 2019!

## 2017-06-12 DIAGNOSIS — T7840XA Allergy, unspecified, initial encounter: Secondary | ICD-10-CM | POA: Insufficient documentation

## 2017-06-12 NOTE — Assessment & Plan Note (Signed)
reports acute facial rash, swelling of tongue and nodules on posterior tongue 1 day following taking fluconazole, approx 2 weeks ago, only residual symptom are  the nodules on posterior tongue. Pt requesting formal allergy testing and referral is being done In the interim , she is to take a 5 day course of benadryl

## 2017-06-12 NOTE — Assessment & Plan Note (Signed)
Increase iron tab to twice daily and rept lab in May

## 2017-06-12 NOTE — Progress Notes (Signed)
   [name]     MRN: [id number]      DOB: 12-27-1980   HPI Ms. [name] is here for follow up reporting allergic reaction to oral fluconazole. She reports hat one day after taking the medication she developed a rash on her face, bumps on the back of her tongue and tongue swelling. She denies any wheezing or difficulty swallowing. She has taken fluconazole in the past with no problem.  She took 3  Benadryl tabs which afforded some relief She has fully recovered for recent respiratory infection, her concern is the allergic reaction and residual posterior tongue lesions   ROS Denies recent fever or chills. Denies sinus pressure, nasal congestion, ear pain or sore throat. Denies chest congestion, productive cough or wheezing. Denies chest pains, palpitations and leg swelling Denies abdominal pain, nausea, vomiting,diarrhea or constipation.   Denies dysuria, frequency, hesitancy or incontinence. Denies joint pain, swelling and limitation in mobility. Denies headaches, seizures, numbness, or tingling. Denies depression, anxiety or insomnia. Marland Kitchen   PE  BP 120/80   Pulse (!) 101   Resp 16   Ht 5\' 3"  (1.6 m)   Wt 144 lb 1.9 oz (65.4 kg)   SpO2 99%   BMI 25.53 kg/m   Patient alert and oriented and in no cardiopulmonary distress.  HEENT: No facial asymmetry, EOMI,   oropharynx pink and moist.  Neck supple no JVD, no mass.raised papular lesions on posterior tongue , white, no adenopathy, oropharyngeal airway is fully patent Chest: Clear to auscultation bilaterally.  CVS: S1, S2 no murmurs, no S3.Regular rate.  ABD: Soft non tender.   Ext: No edema  MS: Adequate ROM spine, shoulders, hips and knees.  Skin: Intact, fine papular  rash noted.on face , skin on face noted to be dry  Psych: Good eye contact, normal affect. Memory intact not anxious or depressed appearing.  CNS: CN 2-12 intact, power,  normal throughout.no focal deficits noted.   Assessment & Plan  HTN  (hypertension) Controlled, no change in medication DASH diet and commitment to daily physical activity for a minimum of 30 minutes discussed and encouraged, as a part of hypertension management. The importance of attaining a healthy weight is also discussed.  BP/Weight 06/08/2017 05/18/2017 05/03/2017 06/04/2016 11/24/2015 01/05/2015 8/36/6294  Systolic BP 765 465 035 465 681 275 170  Diastolic BP 80 80 84 78 86 76 80  Wt. (Lbs) 144.12 144.12 142 144 143 - 137  BMI 25.53 25.53 25.15 25.51 25.34 - 24.27       Vitamin d deficiency Updated lab needed at/ before next visit.   Prolonged periods Management per gyne, pt plans to have partial hysterectomy,  No interest in future pregnancies  IDA (iron deficiency anemia) Increase iron tab to twice daily and rept lab in May  Allergic reaction to drug reports acute facial rash, swelling of tongue and nodules on posterior tongue 1 day following taking fluconazole, approx 2 weeks ago, only residual symptom are  the nodules on posterior tongue. Pt requesting formal allergy testing and referral is being done In the interim , she is to take a 5 day course of benadryl

## 2017-06-12 NOTE — Assessment & Plan Note (Signed)
Management per gyne, pt plans to have partial hysterectomy,  No interest in future pregnancies

## 2017-06-12 NOTE — Assessment & Plan Note (Signed)
Updated lab needed at/ before next visit.   

## 2017-06-12 NOTE — Assessment & Plan Note (Signed)
Controlled, no change in medication DASH diet and commitment to daily physical activity for a minimum of 30 minutes discussed and encouraged, as a part of hypertension management. The importance of attaining a healthy weight is also discussed.  BP/Weight 06/08/2017 05/18/2017 05/03/2017 06/04/2016 11/24/2015 01/05/2015 4/70/9628  Systolic BP 366 294 765 465 035 465 681  Diastolic BP 80 80 84 78 86 76 80  Wt. (Lbs) 144.12 144.12 142 144 143 - 137  BMI 25.53 25.53 25.15 25.51 25.34 - 24.27

## 2017-07-13 ENCOUNTER — Other Ambulatory Visit: Payer: Self-pay | Admitting: Family Medicine

## 2017-07-13 ENCOUNTER — Telehealth: Payer: Self-pay | Admitting: Family Medicine

## 2017-07-13 DIAGNOSIS — L02411 Cutaneous abscess of right axilla: Secondary | ICD-10-CM

## 2017-07-13 NOTE — Telephone Encounter (Signed)
Pt is calling in for a referral for the Boil Under Right arm to have lanced off

## 2017-07-13 NOTE — Telephone Encounter (Signed)
Please call [facility] for walk in appt for her asap, she has been defore, thanks, I am referring

## 2017-07-25 ENCOUNTER — Ambulatory Visit: Payer: Self-pay | Admitting: Allergy and Immunology

## 2017-09-15 ENCOUNTER — Telehealth: Payer: Self-pay | Admitting: Family Medicine

## 2017-09-15 NOTE — Telephone Encounter (Signed)
Called patient to R/S appt per R/S list, she states she will call back to schedule because she is not sure of what her schedule is going to be at that time.

## 2017-10-19 ENCOUNTER — Ambulatory Visit: Payer: Commercial Managed Care - PPO | Admitting: Family Medicine

## 2017-12-22 ENCOUNTER — Other Ambulatory Visit: Payer: Self-pay | Admitting: Family Medicine

## 2017-12-22 DIAGNOSIS — I10 Essential (primary) hypertension: Secondary | ICD-10-CM

## 2017-12-23 ENCOUNTER — Other Ambulatory Visit: Payer: Self-pay | Admitting: Family Medicine

## 2017-12-23 DIAGNOSIS — I10 Essential (primary) hypertension: Secondary | ICD-10-CM

## 2018-01-18 ENCOUNTER — Ambulatory Visit (INDEPENDENT_AMBULATORY_CARE_PROVIDER_SITE_OTHER): Payer: Commercial Managed Care - PPO | Admitting: Family Medicine

## 2018-01-18 ENCOUNTER — Encounter: Payer: Self-pay | Admitting: Family Medicine

## 2018-01-18 ENCOUNTER — Other Ambulatory Visit: Payer: Self-pay

## 2018-01-18 VITALS — BP 134/64 | HR 75 | Temp 99.0°F | Resp 12 | Ht 63.0 in | Wt 141.0 lb

## 2018-01-18 DIAGNOSIS — I10 Essential (primary) hypertension: Secondary | ICD-10-CM

## 2018-01-18 DIAGNOSIS — D509 Iron deficiency anemia, unspecified: Secondary | ICD-10-CM | POA: Diagnosis not present

## 2018-01-18 DIAGNOSIS — N926 Irregular menstruation, unspecified: Secondary | ICD-10-CM

## 2018-01-18 DIAGNOSIS — J0140 Acute pansinusitis, unspecified: Secondary | ICD-10-CM | POA: Diagnosis not present

## 2018-01-18 DIAGNOSIS — J029 Acute pharyngitis, unspecified: Secondary | ICD-10-CM | POA: Insufficient documentation

## 2018-01-18 LAB — POCT RAPID STREP A (OFFICE): Rapid Strep A Screen: NEGATIVE

## 2018-01-18 MED ORDER — SULFAMETHOXAZOLE-TRIMETHOPRIM 800-160 MG PO TABS: 1.0000 | ORAL_TABLET | Freq: Two times a day (BID) | ORAL | 0 refills | Status: DC

## 2018-01-18 NOTE — Patient Instructions (Signed)
F/U in 6 months, call if you need me before  You are treated for acute sinusitis, 1 week of septra is prescribed, drink a lot of fluids   You may use OTC antifungal vaginal cream if needed , for itch after the antibiotic  Please take Vit D3 2000 IU every day (OTC)  CBC, iron and ferritin , chem 7 and EGFR  Today  You do not have strep throat  Thanks for choosing [facility], we consider it a privelige to serve you.   It is important that you exercise regularly at least 30 minutes 5 times a week. If you develop chest pain, have severe difficulty breathing, or feel very tired, stop exercising immediately and seek medical attention

## 2018-01-18 NOTE — Progress Notes (Signed)
   [name]     MRN: [id number]      DOB: [birth year]   HPI [name] is here with a  1 week h/o sinus pressure and yellow green nasal drainage and spurtum which is thick and yellow. She also c/o intermittent low grade fever to 100 and chills. Spouse was sick.  Has been exercising regularly and watching her food choice Still heavy cycles,  Takes iron once daily   ROS . Denies chest pains, palpitations and leg swelling Denies abdominal pain, nausea, vomiting,diarrhea or constipation.   Denies dysuria, frequency, hesitancy or incontinence. Denies joint pain, swelling and limitation in mobility. Denies headaches, seizures, numbness, or tingling. Denies depression, anxiety or insomnia. Denies skin break down or rash.   PE  BP 134/64 (BP Location: Right Arm, Patient Position: Sitting, Cuff Size: Normal)   Pulse 75   Temp 99 F (37.2 C) (Oral)   Resp 12   Ht 5\' 3"  (1.6 m)   Wt 141 lb 0.6 oz (64 kg)   SpO2 99% Comment: room air  BMI 24.98 kg/m   Patient alert and oriented and in no cardiopulmonary distress.  HEENT: No facial asymmetry, EOMI,   oropharynx erythematous and moist.  Neck supple, anterior cervical adenopathy no JVD, no mass.Maxillary sinus tenderness, right greater than left, TM clear  Chest: Clear to auscultation bilaterally.  CVS: S1, S2 no murmurs, no S3.Regular rate.  ABD: Soft non tender.   Ext: No edema  MS: Adequate ROM spine, shoulders, hips and knees.  Skin: Intact, no ulcerations or rash noted.  Psych: Good eye contact, normal affect. Memory intact not anxious or depressed appearing.  CNS: CN 2-12 intact, power,  normal throughout.no focal deficits noted.   Assessment & Plan  Acute sinusitis Antibiotic prescribed she will use topical antifungal if needed   HTN (hypertension) Controlled, no change in medication DASH diet and commitment to daily physical activity for a minimum of 30 minutes discussed and encouraged, as a part of  hypertension management. The importance of attaining a healthy weight is also discussed.  BP/Weight 01/18/2018 06/08/2017 05/18/2017 05/03/2017 06/04/2016 11/24/2015 6/94/8546  Systolic BP 270 350 093 818 299 371 696  Diastolic BP 64 80 80 84 78 86 76  Wt. (Lbs) 141.04 144.12 144.12 142 144 143 -  BMI 24.98 25.53 25.53 25.15 25.51 25.34 -       Sore throat Symptomatic with red oropharynx, no exudate. Rapid strep is negative  Prolonged periods still a problem, plans to have 'procedure" not clear exactly what  IDA (iron deficiency anemia) Improved, she is to continue daily iron

## 2018-01-19 ENCOUNTER — Encounter: Payer: Self-pay | Admitting: Family Medicine

## 2018-01-19 LAB — BASIC METABOLIC PANEL WITH GFR
BUN: 9 mg/dL (ref 7–25)
CALCIUM: 9.5 mg/dL (ref 8.6–10.2)
CHLORIDE: 103 mmol/L (ref 98–110)
CO2: 26 mmol/L (ref 20–32)
Creat: 0.66 mg/dL (ref 0.50–1.10)
GFR, Est African American: 131 mL/min/{1.73_m2} (ref >=60)
GFR, Est Non African American: 113 mL/min/{1.73_m2} (ref >=60)
GLUCOSE: 92 mg/dL (ref 65–139)
Potassium: 4 mmol/L (ref 3.5–5.3)
Sodium: 139 mmol/L (ref 135–146)

## 2018-01-19 LAB — CBC
HEMATOCRIT: 34.8 % — AB (ref 35.0–45.0)
HEMOGLOBIN: 11.1 g/dL — AB (ref 11.7–15.5)
MCH: 26.4 pg — ABNORMAL LOW (ref 27.0–33.0)
MCHC: 31.9 g/dL — ABNORMAL LOW (ref 32.0–36.0)
MCV: 82.9 fL (ref 80.0–100.0)
MPV: 11 fL (ref 7.5–12.5)
Platelets: 319 10*3/uL (ref 140–400)
RBC: 4.2 10*6/uL (ref 3.80–5.10)
RDW: 12.8 % (ref 11.0–15.0)
WBC: 11.1 10*3/uL — AB (ref 3.8–10.8)

## 2018-01-19 LAB — FERRITIN: FERRITIN: 23 ng/mL (ref 16–154)

## 2018-01-19 LAB — IRON: Iron: 44 ug/dL (ref 40–190)

## 2018-01-19 NOTE — Assessment & Plan Note (Signed)
Symptomatic with red oropharynx, no exudate. Rapid strep is negative

## 2018-01-19 NOTE — Assessment & Plan Note (Signed)
Improved, she is to continue daily iron

## 2018-01-19 NOTE — Assessment & Plan Note (Signed)
still a problem, plans to have 'procedure" not clear exactly what

## 2018-01-19 NOTE — Assessment & Plan Note (Signed)
Antibiotic prescribed she will use topical antifungal if needed

## 2018-01-19 NOTE — Assessment & Plan Note (Signed)
Controlled, no change in medication DASH diet and commitment to daily physical activity for a minimum of 30 minutes discussed and encouraged, as a part of hypertension management. The importance of attaining a healthy weight is also discussed.  BP/Weight 01/18/2018 06/08/2017 05/18/2017 05/03/2017 06/04/2016 11/24/2015 0/92/9574  Systolic BP 734 037 096 438 381 840 375  Diastolic BP 64 80 80 84 78 86 76  Wt. (Lbs) 141.04 144.12 144.12 142 144 143 -  BMI 24.98 25.53 25.53 25.15 25.51 25.34 -

## 2018-01-20 ENCOUNTER — Encounter: Payer: Self-pay | Admitting: Family Medicine

## 2018-05-15 ENCOUNTER — Other Ambulatory Visit: Payer: Self-pay | Admitting: Family Medicine

## 2018-05-15 DIAGNOSIS — I10 Essential (primary) hypertension: Secondary | ICD-10-CM

## 2018-05-22 ENCOUNTER — Other Ambulatory Visit: Payer: Self-pay | Admitting: Family Medicine

## 2018-05-22 ENCOUNTER — Encounter: Payer: Self-pay | Admitting: Family Medicine

## 2018-05-22 MED ORDER — AMLODIPINE BESYLATE 10 MG PO TABS: 10.0000 mg | ORAL_TABLET | Freq: Every day | ORAL | 0 refills | Status: DC

## 2018-06-16 ENCOUNTER — Other Ambulatory Visit: Payer: Self-pay | Admitting: Family Medicine

## 2018-06-25 DIAGNOSIS — J111 Influenza due to unidentified influenza virus with other respiratory manifestations: Secondary | ICD-10-CM

## 2018-06-25 HISTORY — DX: Influenza due to unidentified influenza virus with other respiratory manifestations: J11.1

## 2018-06-29 ENCOUNTER — Other Ambulatory Visit: Payer: Self-pay

## 2018-06-29 ENCOUNTER — Encounter (HOSPITAL_BASED_OUTPATIENT_CLINIC_OR_DEPARTMENT_OTHER): Payer: Self-pay

## 2018-06-29 NOTE — Progress Notes (Signed)
Spoke with:  Aima NPO:  No food after midnight/Clear liquids until 5:30 AM DOS Arrival time: 6:30AM Labs: UPT (CBC EKG 07/03/2018 in epic) AM medications: Amlodipine, Pre surgery Drink Pre op orders: Yes Ride home:  Fara Olden (husband) [phone]

## 2018-07-03 ENCOUNTER — Other Ambulatory Visit (HOSPITAL_COMMUNITY): Payer: Self-pay | Admitting: *Deleted

## 2018-07-03 ENCOUNTER — Encounter (HOSPITAL_COMMUNITY)
Admission: RE | Admit: 2018-07-03 | Discharge: 2018-07-03 | Disposition: A | Discharge status: Home / Self Care | Payer: Commercial Managed Care - PPO | Source: Ambulatory Visit | Attending: Obstetrics and Gynecology | Admitting: Obstetrics and Gynecology

## 2018-07-03 DIAGNOSIS — Z01818 Encounter for other preprocedural examination: Secondary | ICD-10-CM | POA: Diagnosis not present

## 2018-07-03 LAB — CBC
HCT: 35.2 % — ABNORMAL LOW (ref 36.0–46.0)
HEMOGLOBIN: 10.7 g/dL — AB (ref 12.0–15.0)
MCH: 25.3 pg — ABNORMAL LOW (ref 26.0–34.0)
MCHC: 30.4 g/dL (ref 30.0–36.0)
MCV: 83.2 fL (ref 80.0–100.0)
PLATELETS: 358 10*3/uL (ref 150–400)
RBC: 4.23 MIL/uL (ref 3.87–5.11)
RDW: 14 % (ref 11.5–15.5)
WBC: 7.7 10*3/uL (ref 4.0–10.5)
nRBC: 0 % (ref 0.0–0.2)

## 2018-07-03 LAB — BASIC METABOLIC PANEL
Anion gap: 6 (ref 5–15)
BUN: 15 mg/dL (ref 6–20)
CALCIUM: 9 mg/dL (ref 8.9–10.3)
CO2: 26 mmol/L (ref 22–32)
Chloride: 106 mmol/L (ref 98–111)
Creatinine, Ser: 0.72 mg/dL (ref 0.44–1.00)
GFR calc Af Amer: >60 mL/min (ref >60)
GFR calc non Af Amer: >60 mL/min (ref >60)
Glucose, Bld: 90 mg/dL (ref 70–99)
Potassium: 3.7 mmol/L (ref 3.5–5.1)
Sodium: 138 mmol/L (ref 135–145)

## 2018-07-05 NOTE — H&P (Signed)
[name] is an 38 y.o. female  G2P0202 with undesired fertility for Bilateral tubal ligation.  D/w pt r/b/a of surgery.  Voices understanding, wishes to proceed.  Pt with CHTN.    Pertinent Gynecological History: OB History: G2, P0202 + abn pap - last 2018 HR HPV neg No STD  32wk SVD x2   Menstrual History:  Patient's last menstrual period was 06/07/2018 (exact date).    Past Medical History:  Diagnosis Date  . Anxiety   . Axillary abscess 2014   Left axillary abscess  . Boil, thigh 05/2014   Right inner  . Flu 06/25/2018  . 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 Iron deficiency anemia   . PONV (postoperative nausea and vomiting)   . Pregnancy induced hypertension   . Prolonged periods 02/28/2014  . Sebaceous cyst of left axilla 05/08/2017  . Vitamin D deficiency    resolved  . Wears glasses     Past Surgical History:  Procedure Laterality Date  . INCISE AND DRAIN ABCESS  2014    Family History  Problem Relation Age of Onset  . Hypertension Mother   . Hypertension Father   . Diabetes Maternal Aunt   . Diabetes Maternal Grandmother   . Diabetes Maternal Grandfather   . Hypertension Paternal Grandmother     Social History:  reports that she has never smoked. She has never used smokeless tobacco. She reports that she does not drink alcohol or use drugs. married, [employer]'t.    Allergies:  Allergies  Allergen Reactions  . Diflucan [Fluconazole]     Rash   . Penicillins Hives and Nausea And Vomiting   Meds: amlodipine, benzontate  Review of Systems  Constitutional: Negative.   HENT: Negative.   Eyes: Negative.   Respiratory: Negative.   Cardiovascular: Negative.   Gastrointestinal: Negative.   Genitourinary: Negative.   Musculoskeletal: Negative.   Skin: Negative.   Neurological: Negative.   Psychiatric/Behavioral: Negative.     Height 5\' 3"  (1.6 m), weight 61.7 kg, last menstrual period 06/07/2018. Physical Exam  Constitutional: She is oriented to  person, place, and time. She appears well-developed and well-nourished.  HENT:  Head: Normocephalic and atraumatic.  Cardiovascular: Normal rate and regular rhythm.  Respiratory: Effort normal and breath sounds normal. No respiratory distress. She has no wheezes.  GI: Soft. Bowel sounds are normal. She exhibits no distension. There is no abdominal tenderness.  Musculoskeletal: Normal range of motion.  Neurological: She is alert and oriented to person, place, and time.  Skin: Skin is warm and dry.  Psychiatric: She has a normal mood and affect. Her behavior is normal.    Assessment/Plan: 37yo [id number] for BTL due to undesired fertility D/w pt r/b/a of surgery, voices understanding  Helmuth Recupero Bovard-Stuckert 07/05/2018, 1:38 PM

## 2018-07-06 NOTE — Anesthesia Preprocedure Evaluation (Addendum)
Anesthesia Evaluation  Patient identified by MRN, date of birth, ID band Patient awake    Reviewed: Allergy & Precautions, NPO status , Patient's Chart, lab work & pertinent test results  History of Anesthesia Complications Negative for: history of anesthetic complications  Airway Mallampati: III  TM Distance: >3 FB Neck ROM: Full    Dental  (+) Dental Advisory Given, Teeth Intact   Pulmonary neg pulmonary ROS,    breath sounds clear to auscultation       Cardiovascular hypertension, Pt. on medications  Rhythm:Regular Rate:Normal     Neuro/Psych PSYCHIATRIC DISORDERS Anxiety negative neurological ROS     GI/Hepatic negative GI ROS, Neg liver ROS,   Endo/Other  negative endocrine ROS  Renal/GU negative Renal ROS     Musculoskeletal negative musculoskeletal ROS (+)   Abdominal   Peds  Hematology  (+) anemia ,   Anesthesia Other Findings   Reproductive/Obstetrics  Hx PIH                            Anesthesia Physical Anesthesia Plan  ASA: II  Anesthesia Plan: General   Post-op Pain Management:    Induction: Intravenous  PONV Risk Score and Plan: 3 and Treatment may vary due to age or medical condition, Ondansetron, Dexamethasone, Midazolam and Scopolamine patch - Pre-op  Airway Management Planned: Oral ETT  Additional Equipment: None  Intra-op Plan:   Post-operative Plan: Extubation in OR  Informed Consent: I have reviewed the patients History and Physical, chart, labs and discussed the procedure including the risks, benefits and alternatives for the proposed anesthesia with the patient or authorized representative who has indicated his/her understanding and acceptance.     Dental advisory given  Plan Discussed with: CRNA and Anesthesiologist  Anesthesia Plan Comments:        Anesthesia Quick Evaluation

## 2018-07-07 ENCOUNTER — Encounter (HOSPITAL_BASED_OUTPATIENT_CLINIC_OR_DEPARTMENT_OTHER): Payer: Self-pay | Admitting: *Deleted

## 2018-07-07 ENCOUNTER — Ambulatory Visit (HOSPITAL_BASED_OUTPATIENT_CLINIC_OR_DEPARTMENT_OTHER): Payer: Commercial Managed Care - PPO | Admitting: Anesthesiology

## 2018-07-07 ENCOUNTER — Encounter (HOSPITAL_BASED_OUTPATIENT_CLINIC_OR_DEPARTMENT_OTHER)
Admission: RE | Disposition: A | Discharge status: Home / Self Care | Payer: Self-pay | Source: Ambulatory Visit | Attending: Obstetrics and Gynecology

## 2018-07-07 ENCOUNTER — Ambulatory Visit (HOSPITAL_BASED_OUTPATIENT_CLINIC_OR_DEPARTMENT_OTHER)
Admission: RE | Admit: 2018-07-07 | Discharge: 2018-07-07 | Disposition: A | Discharge status: Home / Self Care | Payer: Commercial Managed Care - PPO | Source: Ambulatory Visit | Attending: Obstetrics and Gynecology | Admitting: Obstetrics and Gynecology

## 2018-07-07 ENCOUNTER — Other Ambulatory Visit: Payer: Self-pay

## 2018-07-07 DIAGNOSIS — I10 Essential (primary) hypertension: Secondary | ICD-10-CM | POA: Insufficient documentation

## 2018-07-07 DIAGNOSIS — Z79899 Other long term (current) drug therapy: Secondary | ICD-10-CM | POA: Insufficient documentation

## 2018-07-07 DIAGNOSIS — Z883 Allergy status to other anti-infective agents status: Secondary | ICD-10-CM | POA: Diagnosis not present

## 2018-07-07 DIAGNOSIS — Z302 Encounter for sterilization: Secondary | ICD-10-CM | POA: Insufficient documentation

## 2018-07-07 DIAGNOSIS — N838 Other noninflammatory disorders of ovary, fallopian tube and broad ligament: Secondary | ICD-10-CM | POA: Insufficient documentation

## 2018-07-07 DIAGNOSIS — Z880 Allergy status to penicillin: Secondary | ICD-10-CM | POA: Insufficient documentation

## 2018-07-07 DIAGNOSIS — Z9851 Tubal ligation status: Secondary | ICD-10-CM

## 2018-07-07 HISTORY — DX: Vitamin D deficiency, unspecified: E55.9

## 2018-07-07 HISTORY — DX: Other specified postprocedural states: Z98.890

## 2018-07-07 HISTORY — DX: Tubal ligation status: Z98.51

## 2018-07-07 HISTORY — DX: Influenza due to unidentified influenza virus with other respiratory manifestations: J11.1

## 2018-07-07 HISTORY — PX: LAPAROSCOPIC BILATERAL SALPINGECTOMY: SHX5889

## 2018-07-07 HISTORY — DX: Furuncle of limb, unspecified: L02.429

## 2018-07-07 HISTORY — DX: Iron deficiency anemia, unspecified: D50.9

## 2018-07-07 HISTORY — DX: Papillomavirus as the cause of diseases classified elsewhere: B97.7

## 2018-07-07 HISTORY — DX: Sebaceous cyst: L72.3

## 2018-07-07 HISTORY — DX: Anxiety disorder, unspecified: F41.9

## 2018-07-07 HISTORY — DX: Presence of spectacles and contact lenses: Z97.3

## 2018-07-07 HISTORY — DX: Other specified postprocedural states: R11.2

## 2018-07-07 LAB — POCT PREGNANCY, URINE: Preg Test, Ur: NEGATIVE

## 2018-07-07 SURGERY — SALPINGECTOMY, BILATERAL, LAPAROSCOPIC
Anesthesia: General | Laterality: Bilateral

## 2018-07-07 MED ORDER — SUGAMMADEX SODIUM 200 MG/2ML IV SOLN
INTRAVENOUS | Status: DC | PRN
  Administered 2018-07-07: 200 mg via INTRAVENOUS

## 2018-07-07 MED ORDER — BUPIVACAINE HCL (PF) 0.25 % IJ SOLN
INTRAMUSCULAR | Status: DC | PRN
  Administered 2018-07-07: 4 mL
  Administered 2018-07-07: 16 mL

## 2018-07-07 MED ORDER — SCOPOLAMINE 1 MG/3DAYS TD PT72
MEDICATED_PATCH | TRANSDERMAL | Status: AC
  Filled 2018-07-07: qty 1

## 2018-07-07 MED ORDER — PROPOFOL 10 MG/ML IV BOLUS
INTRAVENOUS | Status: AC
  Filled 2018-07-07: qty 20

## 2018-07-07 MED ORDER — ROCURONIUM BROMIDE 100 MG/10ML IV SOLN
INTRAVENOUS | Status: AC
  Filled 2018-07-07: qty 1

## 2018-07-07 MED ORDER — ONDANSETRON HCL 4 MG/2ML IJ SOLN
INTRAMUSCULAR | Status: DC | PRN
  Administered 2018-07-07: 4 mg via INTRAVENOUS

## 2018-07-07 MED ORDER — LACTATED RINGERS IV SOLN
INTRAVENOUS | Status: DC
  Administered 2018-07-07 (×2): via INTRAVENOUS
  Filled 2018-07-07: qty 1000

## 2018-07-07 MED ORDER — FENTANYL CITRATE (PF) 100 MCG/2ML IJ SOLN
INTRAMUSCULAR | Status: AC
  Filled 2018-07-07: qty 2

## 2018-07-07 MED ORDER — ROCURONIUM BROMIDE 100 MG/10ML IV SOLN
INTRAVENOUS | Status: DC | PRN
  Administered 2018-07-07: 50 mg via INTRAVENOUS

## 2018-07-07 MED ORDER — KETOROLAC TROMETHAMINE 30 MG/ML IJ SOLN
INTRAMUSCULAR | Status: AC
  Filled 2018-07-07: qty 1

## 2018-07-07 MED ORDER — MIDAZOLAM HCL 5 MG/5ML IJ SOLN
INTRAMUSCULAR | Status: DC | PRN
  Administered 2018-07-07: 2 mg via INTRAVENOUS

## 2018-07-07 MED ORDER — OXYCODONE HCL 5 MG PO TABS: ORAL_TABLET | ORAL | 0 refills | Status: DC

## 2018-07-07 MED ORDER — KETOROLAC TROMETHAMINE 30 MG/ML IJ SOLN
INTRAMUSCULAR | Status: DC | PRN
  Administered 2018-07-07: 30 mg via INTRAVENOUS

## 2018-07-07 MED ORDER — FENTANYL CITRATE (PF) 100 MCG/2ML IJ SOLN
25.0000 ug | INTRAMUSCULAR | Status: DC | PRN
  Filled 2018-07-07: qty 1

## 2018-07-07 MED ORDER — BUPIVACAINE HCL (PF) 0.25 % IJ SOLN
INTRAMUSCULAR | Status: AC
  Filled 2018-07-07: qty 30

## 2018-07-07 MED ORDER — LIDOCAINE 2% (20 MG/ML) 5 ML SYRINGE
INTRAMUSCULAR | Status: AC
  Filled 2018-07-07: qty 5

## 2018-07-07 MED ORDER — ONDANSETRON HCL 4 MG/2ML IJ SOLN
4.0000 mg | Freq: Once | INTRAMUSCULAR | Status: AC
  Administered 2018-07-07: 4 mg via INTRAVENOUS
  Filled 2018-07-07: qty 2

## 2018-07-07 MED ORDER — FENTANYL CITRATE (PF) 100 MCG/2ML IJ SOLN
INTRAMUSCULAR | Status: DC | PRN
  Administered 2018-07-07 (×2): 25 ug via INTRAVENOUS
  Administered 2018-07-07: 50 ug via INTRAVENOUS
  Administered 2018-07-07: 100 ug via INTRAVENOUS

## 2018-07-07 MED ORDER — PROPOFOL 500 MG/50ML IV EMUL
INTRAVENOUS | Status: AC
  Filled 2018-07-07: qty 50

## 2018-07-07 MED ORDER — ONDANSETRON HCL 4 MG/2ML IJ SOLN
INTRAMUSCULAR | Status: AC
  Filled 2018-07-07: qty 2

## 2018-07-07 MED ORDER — PROMETHAZINE HCL 25 MG/ML IJ SOLN
6.2500 mg | INTRAMUSCULAR | Status: DC | PRN
  Filled 2018-07-07: qty 1

## 2018-07-07 MED ORDER — IBUPROFEN 800 MG PO TABS: 800.0000 mg | ORAL_TABLET | Freq: Three times a day (TID) | ORAL | 1 refills | Status: DC | PRN

## 2018-07-07 MED ORDER — SCOPOLAMINE 1 MG/3DAYS TD PT72
MEDICATED_PATCH | TRANSDERMAL | Status: DC | PRN
  Administered 2018-07-07: 1 via TRANSDERMAL

## 2018-07-07 MED ORDER — DEXAMETHASONE SODIUM PHOSPHATE 10 MG/ML IJ SOLN
INTRAMUSCULAR | Status: AC
  Filled 2018-07-07: qty 1

## 2018-07-07 MED ORDER — OXYCODONE HCL 5 MG PO TABS
5.0000 mg | ORAL_TABLET | Freq: Once | ORAL | Status: DC | PRN
  Filled 2018-07-07: qty 1

## 2018-07-07 MED ORDER — MIDAZOLAM HCL 2 MG/2ML IJ SOLN
INTRAMUSCULAR | Status: AC
  Filled 2018-07-07: qty 2

## 2018-07-07 MED ORDER — OXYCODONE HCL 5 MG/5ML PO SOLN
5.0000 mg | Freq: Once | ORAL | Status: DC | PRN
  Filled 2018-07-07: qty 5

## 2018-07-07 MED ORDER — LIDOCAINE HCL (CARDIAC) PF 100 MG/5ML IV SOSY
PREFILLED_SYRINGE | INTRAVENOUS | Status: DC | PRN
  Administered 2018-07-07: 100 mg via INTRAVENOUS

## 2018-07-07 MED ORDER — DEXAMETHASONE SODIUM PHOSPHATE 4 MG/ML IJ SOLN
INTRAMUSCULAR | Status: DC | PRN
  Administered 2018-07-07: 10 mg via INTRAVENOUS

## 2018-07-07 MED ORDER — OXYCODONE HCL 5 MG PO TABS
ORAL_TABLET | ORAL | Status: AC
  Filled 2018-07-07: qty 1

## 2018-07-07 MED ORDER — PROPOFOL 10 MG/ML IV BOLUS
INTRAVENOUS | Status: DC | PRN
  Administered 2018-07-07: 200 mg via INTRAVENOUS

## 2018-07-07 SURGICAL SUPPLY — 31 items
ADH SKN CLS APL DERMABOND .7 (GAUZE/BANDAGES/DRESSINGS) ×1
BAG RETRIEVAL 10MM (BASKET)
CABLE HIGH FREQUENCY MONO STRZ (ELECTRODE) IMPLANT
COVER MAYO STAND STRL (DRAPES) IMPLANT
COVER WAND RF STERILE (DRAPES) ×3 IMPLANT
DERMABOND ADVANCED (GAUZE/BANDAGES/DRESSINGS) ×2
DERMABOND ADVANCED .7 DNX12 (GAUZE/BANDAGES/DRESSINGS) ×1 IMPLANT
DRSG OPSITE POSTOP 3X4 (GAUZE/BANDAGES/DRESSINGS) IMPLANT
DURAPREP 26ML APPLICATOR (WOUND CARE) ×3 IMPLANT
GAUZE 4X4 16PLY RFD (DISPOSABLE) ×3 IMPLANT
GAUZE VASELINE 3X9 (GAUZE/BANDAGES/DRESSINGS) ×3 IMPLANT
GLOVE BIO SURGEON STRL SZ 6.5 (GLOVE) ×2 IMPLANT
GLOVE BIO SURGEONS STRL SZ 6.5 (GLOVE) ×1
GOWN STRL REUS W/TWL LRG LVL3 (GOWN DISPOSABLE) ×6 IMPLANT
NEEDLE INSUFFLATION 120MM (ENDOMECHANICALS) ×3 IMPLANT
NS IRRIG 500ML POUR BTL (IV SOLUTION) ×3 IMPLANT
PACK LAPAROSCOPY BASIN (CUSTOM PROCEDURE TRAY) ×3 IMPLANT
SET IRRIG TUBING LAPAROSCOPIC (IRRIGATION / IRRIGATOR) IMPLANT
SHEARS HARMONIC ACE PLUS 36CM (ENDOMECHANICALS) ×3 IMPLANT
SUT VIC AB 2-0 CT2 27 (SUTURE) IMPLANT
SUT VICRYL 0 UR6 27IN ABS (SUTURE) IMPLANT
SUT VICRYL 4-0 PS2 18IN ABS (SUTURE) ×3 IMPLANT
SYS BAG RETRIEVAL 10MM (BASKET)
SYSTEM BAG RETRIEVAL 10MM (BASKET) IMPLANT
TOWEL OR 17X24 6PK STRL BLUE (TOWEL DISPOSABLE) ×6 IMPLANT
TRAY FOLEY BAG SILVER LF 14FR (CATHETERS) ×3 IMPLANT
TROCAR BALLN 12MMX100 BLUNT (TROCAR) IMPLANT
TROCAR XCEL NON-BLD 11X100MML (ENDOMECHANICALS) IMPLANT
TROCAR XCEL NON-BLD 5MMX100MML (ENDOMECHANICALS) ×6 IMPLANT
TUBING INSUF HEATED (TUBING) ×3 IMPLANT
WARMER LAPAROSCOPE (MISCELLANEOUS) ×3 IMPLANT

## 2018-07-07 NOTE — Discharge Instructions (Signed)
HOME CARE INSTRUCTIONS - LAPAROSCOPY  Wound Care: The bandaids or dressing which are placed over the skin openings may be removed the day after surgery. The incision should be kept clean and dry. The stitches do not need to be removed. Should the incision become sore, red, and swollen after the first week, check with your doctor.  Personal Hygiene: Shower the day after your procedure. Always wipe from front to back after elimination.   Activity: Do not drive or operate any equipment today. The effects of the anesthesia are still present and drowsiness may result. Rest today, not necessarily flat bed rest, just take it easy. You may resume your normal activity in one to three days or as instructed by your physician.  Sexual Activity: You resume sexual activity as indicated by your physician_________. If your laparoscopy was for a sterilization ( tubes tied ), continue current method of birth control until after your next period or ask for specific instructions from your doctor.  Diet: Eat a light diet as desired this evening. You may resume a regular diet tomorrow.  Return to Work: Two to three days or as indicated by your doctor.  Expectations After Surgery: Your surgery will cause vaginal drainage or spotting which may continue for 2-3 days. Mild abdominal discomfort or tenderness is not unusual and some shoulder pain may also be noted which can be relieved by lying flat in pain.  Call Your Doctor If these Occur:  Persistent or heavy bleeding at incision site       Redness or swelling around incision       Elevation of temperature greater than 100 degrees F   Post Anesthesia Home Care Instructions  Activity: Get plenty of rest for the remainder of the day. A responsible adult should stay with you for 24 hours following the procedure.  For the next 24 hours, DO NOT: -Drive a car -Paediatric nurse -Drink alcoholic beverages -Take any medication unless instructed by your  physician -Make any legal decisions or sign important papers.  Meals: Start with liquid foods such as gelatin or soup. Progress to regular foods as tolerated. Avoid greasy, spicy, heavy foods. If nausea and/or vomiting occur, drink only clear liquids until the nausea and/or vomiting subsides. Call your physician if vomiting continues.  Special Instructions/Symptoms: Your throat may feel dry or sore from the anesthesia or the breathing tube placed in your throat during surgery. If this causes discomfort, gargle with warm salt water. The discomfort should disappear within 24 hours.  If you had a scopolamine patch placed behind your ear for the management of post- operative nausea and/or vomiting:  1. The medication in the patch is effective for 72 hours, after which it should be removed.  Wrap patch in a tissue and discard in the trash. Wash hands thoroughly with soap and water. 2. You may remove the patch earlier than 72 hours if you experience unpleasant side effects which may include dry mouth, dizziness or visual disturbances. 3. Avoid touching the patch. Wash your hands with soap and water after contact with the patch.

## 2018-07-07 NOTE — Brief Op Note (Signed)
07/07/2018  9:21 AM  PATIENT:  Adora Fridge  38 y.o. female  PRE-OPERATIVE DIAGNOSIS:  desires sterilization  POST-OPERATIVE DIAGNOSIS:  desires sterilization  PROCEDURE:  Procedure(s) with comments: LAPAROSCOPIC BILATERAL SALPINGECTOMY (Bilateral) - MD request an RNFA  SURGEON:  Surgeon(s) and Role:    * Bovard-Stuckert, Ivie Savitt, MD - Primary  ANESTHESIA:   local and general  EBL:  10 mL IVF per anesthesia, uop 150cc   BLOOD ADMINISTERED:none  DRAINS: none   LOCAL MEDICATIONS USED:  LIDOCAINE   SPECIMEN:  Source of Specimen:  B tubal segments  DISPOSITION OF SPECIMEN:  PATHOLOGY  COUNTS:  YES  TOURNIQUET:  * No tourniquets in log *  DICTATION: .Other Dictation: Dictation Number [id number]  PLAN OF CARE: Discharge to home after PACU  PATIENT DISPOSITION:  PACU - hemodynamically stable.   Delay start of Pharmacological VTE agent (>24hrs) due to surgical blood loss or risk of bleeding: not applicable

## 2018-07-07 NOTE — Interval H&P Note (Signed)
History and Physical Interval Note:  07/07/2018 8:05 AM  [name]  has presented today for surgery, with the diagnosis of sterilization  The various methods of treatment have been discussed with the patient and family. After consideration of risks, benefits and other options for treatment, the patient has consented to  Procedure(s) with comments: LAPAROSCOPIC BILATERAL SALPINGECTOMY (Bilateral) - MD request an RNFA as a surgical intervention .  The patient's history has been reviewed, patient examined, no change in status, stable for surgery.  I have reviewed the patient's chart and labs.  Questions were answered to the patient's satisfaction.     [name]

## 2018-07-07 NOTE — Anesthesia Procedure Notes (Signed)
Procedure Name: Intubation Date/Time: 07/07/2018 8:24 AM Performed by: Justice Rocher, CRNA Pre-anesthesia Checklist: Patient identified, Emergency Drugs available, Suction available and Patient being monitored Patient Re-evaluated:Patient Re-evaluated prior to induction Oxygen Delivery Method: Circle system utilized Preoxygenation: Pre-oxygenation with 100% oxygen Induction Type: IV induction Ventilation: Mask ventilation without difficulty Laryngoscope Size: Mac and 3 Grade View: Grade I Tube type: Oral Tube size: 7.0 mm Number of attempts: 1 Airway Equipment and Method: Stylet and Oral airway Placement Confirmation: ETT inserted through vocal cords under direct vision,  positive ETCO2 and breath sounds checked- equal and bilateral Secured at: 21 cm Tube secured with: Tape Dental Injury: Teeth and Oropharynx as per pre-operative assessment

## 2018-07-07 NOTE — Progress Notes (Signed)
Pt vomited 200cc's of clear emesis w partially undigestested crackers.  Cool cloth given,  Blankets changed .  Will continue to monitor .

## 2018-07-07 NOTE — Transfer of Care (Signed)
Immediate Anesthesia Transfer of Care Note  Patient: [name]  Procedure(s) Performed: Procedure(s) (LRB): LAPAROSCOPIC BILATERAL SALPINGECTOMY (Bilateral)  Patient Location: PACU  Anesthesia Type: General  Level of Consciousness: awake, sedated, patient cooperative and responds to stimulation  Airway & Oxygen Therapy: Patient Spontanous Breathing and Patient connected to [place] oxygen  Post-op Assessment: Report given to PACU RN, Post -op Vital signs reviewed and stable and Patient moving all extremities  Post vital signs: Reviewed and stable  Complications: No apparent anesthesia complications

## 2018-07-07 NOTE — Anesthesia Postprocedure Evaluation (Signed)
Anesthesia Post Note  Patient: [name]  Procedure(s) Performed: LAPAROSCOPIC BILATERAL SALPINGECTOMY (Bilateral )     Patient location during evaluation: PACU Anesthesia Type: General Level of consciousness: awake and alert Pain management: pain level controlled Vital Signs Assessment: post-procedure vital signs reviewed and stable Respiratory status: spontaneous breathing, nonlabored ventilation and respiratory function stable Cardiovascular status: blood pressure returned to baseline and stable Postop Assessment: no apparent nausea or vomiting Anesthetic complications: no    Last Vitals:  Vitals:   07/07/18 1000 07/07/18 1015  BP: 113/72 116/69  Pulse: 77 72  Resp: 14 11  Temp:    SpO2: 100% 100%    Last Pain:  Vitals:   07/07/18 1115  TempSrc:   PainSc: [place]

## 2018-07-10 ENCOUNTER — Encounter (HOSPITAL_BASED_OUTPATIENT_CLINIC_OR_DEPARTMENT_OTHER): Payer: Self-pay | Admitting: Obstetrics and Gynecology

## 2018-07-10 NOTE — Op Note (Signed)
NAME: [name], [name] MEDICAL RECORD ON:[id number] ACCOUNT [id number] DATE OF BIRTH:March 20, 1981 FACILITY: [facility] LOCATION: [facility] PHYSICIAN:Shermaine Rivet BOVARD-STUCKERT, MD  OPERATIVE REPORT  DATE OF PROCEDURE:  07/07/2018  PREOPERATIVE DIAGNOSIS:  Undesired fertility.  POSTOPERATIVE DIAGNOSIS:  Undesired fertility.  PROCEDURE:  Laparoscopic tubal ligation by bilateral salpingectomy.  SURGEON:  Janyth Contes, MD  ANESTHESIA:  Local and general.  ESTIMATED BLOOD LOSS:  Approximately 10 mL.  IV FLUIDS:  Per anesthesia.  URINE OUTPUT:  Approximately 150 mL at the end of the case.  FINDINGS:  Normal uterus, questionable endometrial implants behind the right ovary as well as in the posterior cul-de-sac, normal tubes and ovaries bilaterally, normal uterus, normal appendix, normal liver edge and gallbladder.  COMPLICATIONS:  None.  PATHOLOGY:  Bilateral tubal segments.  DESCRIPTION OF PROCEDURE:  After informed consent was reviewed with the patient and her husband including risks, benefits and alternatives of the surgical procedure, she was transported to the operating room, placed on the table in supine position, then  placed in the [place] stirrups.  General anesthesia was induced and found to be adequate.  She was then prepped and draped in the normal sterile fashion, and appropriate timeout was performed.  Using an open-sided speculum and a single-tooth tenaculum,  the [place] manipulator was placed in her uterus.  Gloves and gown were changed.  Attention was turned to the abdominal portion of the case.  Approximately 5 mm horizontal infraumbilical incision was made.  The fascia was cleared off with a hemostat.   Using the Veress needle, entry into the peritoneum was confirmed with a hanging drop test.  The port was placed under direct visualization.  A brief pelvic survey performed revealed the above findings.  Accessory ports were placed on both the right and  left, a 5 mm on  the right under direct visualization and a 10 mm on the left under direct visualization as well.  The right tube was grasped at the fimbriae, and the tube was excised to the level of the cornu using the Harmonic scalpel.  It was placed in  the cul-de-sac.  The attention was then turned to the left, which in a similar fashion was grasped with the fimbriae and excised to the level of the cornu, also placed in the cul-de-sac.  The pedicles were noted to be hemostatic.  The tubes were then  removed through the 10 mm port.  Hemostasis was assured as the pneumo was released.  The ports were removed under direct visualization.  A UR needle was used to close the fascia.  The skin was closed with 4-0 Vicryl and Dermabond.  The patient tolerated  the procedure well.  Sponge, lap and needle counts were correct x2 per the operating staff.     LN/NUANCE  D:07/07/2018 T:07/07/2018 JOB:[id number]

## 2018-07-11 NOTE — Addendum Note (Signed)
Addendum  created 07/11/18 0837 by Bonney Aid, CRNA   Charge Capture section accepted, Visit diagnoses modified

## 2018-07-19 ENCOUNTER — Ambulatory Visit: Payer: Commercial Managed Care - PPO | Admitting: Family Medicine

## 2018-07-28 ENCOUNTER — Other Ambulatory Visit: Payer: Self-pay

## 2018-07-28 ENCOUNTER — Emergency Department (HOSPITAL_COMMUNITY): Payer: Commercial Managed Care - PPO

## 2018-07-28 ENCOUNTER — Encounter (HOSPITAL_COMMUNITY): Payer: Self-pay | Admitting: *Deleted

## 2018-07-28 ENCOUNTER — Emergency Department (HOSPITAL_COMMUNITY)
Admission: EM | Admit: 2018-07-28 | Discharge: 2018-07-29 | Disposition: A | Discharge status: Home / Self Care | Payer: Commercial Managed Care - PPO | Attending: Emergency Medicine | Admitting: Emergency Medicine

## 2018-07-28 DIAGNOSIS — R0789 Other chest pain: Secondary | ICD-10-CM | POA: Diagnosis not present

## 2018-07-28 DIAGNOSIS — Z79899 Other long term (current) drug therapy: Secondary | ICD-10-CM | POA: Insufficient documentation

## 2018-07-28 MED ORDER — SODIUM CHLORIDE 0.9% FLUSH: 3.0000 mL | Freq: Once | INTRAVENOUS | Status: DC

## 2018-07-28 NOTE — ED Triage Notes (Signed)
Pt was bathing her daughters ~36mins ago and started having L sided chest pain, radiating into her L arm. Reports having tubal ligation 2 weeks ago

## 2018-07-29 LAB — I-STAT TROPONIN, ED
Troponin i, poc: 0 ng/mL (ref 0.00–0.08)
Troponin i, poc: 0 ng/mL (ref 0.00–0.08)

## 2018-07-29 LAB — BASIC METABOLIC PANEL
Anion gap: 7 (ref 5–15)
BUN: 17 mg/dL (ref 6–20)
CO2: 24 mmol/L (ref 22–32)
CREATININE: 0.8 mg/dL (ref 0.44–1.00)
Calcium: 9.1 mg/dL (ref 8.9–10.3)
Chloride: 105 mmol/L (ref 98–111)
GFR calc Af Amer: >60 mL/min (ref >60)
GFR calc non Af Amer: >60 mL/min (ref >60)
Glucose, Bld: 124 mg/dL — ABNORMAL HIGH (ref 70–99)
Potassium: 3.9 mmol/L (ref 3.5–5.1)
Sodium: 136 mmol/L (ref 135–145)

## 2018-07-29 LAB — CBC
HCT: 32.2 % — ABNORMAL LOW (ref 36.0–46.0)
Hemoglobin: 9.9 g/dL — ABNORMAL LOW (ref 12.0–15.0)
MCH: 25.9 pg — ABNORMAL LOW (ref 26.0–34.0)
MCHC: 30.7 g/dL (ref 30.0–36.0)
MCV: 84.3 fL (ref 80.0–100.0)
NRBC: 0 % (ref 0.0–0.2)
Platelets: 270 10*3/uL (ref 150–400)
RBC: 3.82 MIL/uL — ABNORMAL LOW (ref 3.87–5.11)
RDW: 14.7 % (ref 11.5–15.5)
WBC: 8.5 10*3/uL (ref 4.0–10.5)

## 2018-07-29 LAB — I-STAT BETA HCG BLOOD, ED (MC, WL, AP ONLY): I-stat hCG, quantitative: <5 m[IU]/mL (ref <5)

## 2018-07-29 MED ORDER — ALUM & MAG HYDROXIDE-SIMETH 200-200-20 MG/5ML PO SUSP
30.0000 mL | Freq: Once | ORAL | Status: AC
  Administered 2018-07-29: 30 mL via ORAL
  Filled 2018-07-29: qty 30

## 2018-07-29 NOTE — ED Notes (Signed)
Pt remains in waiting room. Updated on wait for treatment room. 

## 2018-07-29 NOTE — ED Provider Notes (Signed)
[facility] EMERGENCY DEPARTMENT Provider Note  CSN: [id number] Arrival date & time: 07/28/18 2321  Chief Complaint(s) Chest Pain  HPI [name] is a 38 y.o. female   The history is provided by the patient.  Chest Pain  Pain location:  L chest Pain quality: pressure   Pain severity:  Moderate Onset quality:  Gradual Duration:  6 hours Timing:  Constant Progression:  Unchanged Chronicity:  New Context comment:  Bathing daughter Relieved by:  Nothing Worsened by:  Nothing Associated symptoms: no abdominal pain, no anxiety, no cough, no diaphoresis, no fatigue, no fever, no nausea, no shortness of breath and no vomiting   Risk factors: hypertension   Risk factors: no coronary artery disease, no diabetes mellitus, no high cholesterol, no immobilization, not female, not obese, not pregnant, no prior DVT/PE and no smoking     Past Medical History Past Medical History:  Diagnosis Date  . Anxiety   . Axillary abscess 2014   Left axillary abscess  . Boil, thigh 05/2014   Right inner  . Flu 06/25/2018  . 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 Iron deficiency anemia   . PONV (postoperative nausea and vomiting)   . Pregnancy induced hypertension   . Prolonged periods 02/28/2014  . S/P tubal ligation 07/07/2018  . Sebaceous cyst of left axilla 05/08/2017  . Vitamin D deficiency    resolved  . Wears glasses    Patient Active Problem List   Diagnosis Date Noted  . S/P tubal ligation 07/07/2018  . Sore throat 01/18/2018  . Prolonged periods 02/28/2014  . HTN (hypertension) 10/16/2013  . Acute sinusitis 04/29/2013  . IDA (iron deficiency anemia) 04/29/2013  . Vitamin D deficiency 04/14/2011   Home Medication(s) Prior to Admission medications   Medication Sig Start Date End Date Taking? Authorizing Provider  amLODipine (NORVASC) 10 MG tablet TAKE 1 TABLET BY MOUTH DAILY Patient taking differently: Take 10 mg by mouth daily.  06/16/18  Yes Fayrene Helper, MD    ferrous sulfate 325 (65 FE) MG EC tablet Take twice a day with food Patient taking differently: Take 325 mg by mouth daily with breakfast. Take twice a day with food 06/08/16  Yes Raylene Everts, MD  ibuprofen (ADVIL,MOTRIN) 800 MG tablet Take 1 tablet (800 mg total) by mouth every 8 (eight) hours as needed for moderate pain. Patient not taking: Reported on 07/29/2018 07/07/18   Bovard-Stuckert, Jeral Fruit, MD  oxyCODONE (ROXICODONE) 5 MG immediate release tablet 1-2 tablets q 6h prn severe pain Patient not taking: Reported on 07/29/2018 07/07/18   Janyth Contes, MD                                                                                                                                    Past Surgical History Past Surgical History:  Procedure Laterality Date  . INCISE AND DRAIN ABCESS  2014  . LAPAROSCOPIC BILATERAL SALPINGECTOMY  Bilateral 07/07/2018   Procedure: LAPAROSCOPIC BILATERAL SALPINGECTOMY;  Surgeon: Janyth Contes, MD;  Location: [place];  Service: Gynecology;  Laterality: Bilateral;  MD request an RNFA  . TUBAL LIGATION     Family History Family History  Problem Relation Age of Onset  . Hypertension Mother   . Hypertension Father   . Diabetes Maternal Aunt   . Diabetes Maternal Grandmother   . Diabetes Maternal Grandfather   . Hypertension Paternal Grandmother     Social History Social History   Tobacco Use  . Smoking status: Never Smoker  . Smokeless tobacco: Never Used  Substance Use Topics  . Alcohol use: No  . Drug use: No   Allergies Diflucan [fluconazole] and Penicillins  Review of Systems Review of Systems  Constitutional: Negative for diaphoresis, fatigue and fever.  Respiratory: Negative for cough and shortness of breath.   Cardiovascular: Positive for chest pain.  Gastrointestinal: Negative for abdominal pain, nausea and vomiting.   All other systems are reviewed and are negative for acute change except as noted in  the HPI  Physical Exam Vital Signs  I have reviewed the triage vital signs BP 138/90   Pulse 75   Temp 98.1 F (36.7 C)   Resp 16   Ht 5\' 3"  (1.6 m)   Wt 63.5 kg   LMP 07/25/2018   SpO2 100%   BMI 24.80 kg/m   Physical Exam Vitals signs reviewed.  Constitutional:      General: She is not in acute distress.    Appearance: She is well-developed. She is not diaphoretic.  HENT:     Head: Normocephalic and atraumatic.     Nose: Nose normal.  Eyes:     General: No scleral icterus.       Right eye: No discharge.        Left eye: No discharge.     Conjunctiva/sclera: Conjunctivae normal.     Pupils: Pupils are equal, round, and reactive to light.  Neck:     Musculoskeletal: Normal range of motion and neck supple.  Cardiovascular:     Rate and Rhythm: Normal rate and regular rhythm.     Heart sounds: No murmur. No friction rub. No gallop.   Pulmonary:     Effort: Pulmonary effort is normal. No respiratory distress.     Breath sounds: Normal breath sounds. No stridor. No rales.  Abdominal:     General: There is no distension.     Palpations: Abdomen is soft.     Tenderness: There is no abdominal tenderness.  Musculoskeletal:        General: No tenderness.  Skin:    General: Skin is warm and dry.     Findings: No erythema or rash.  Neurological:     Mental Status: She is alert and oriented to person, place, and time.     ED Results and Treatments Labs (all labs ordered are listed, but only abnormal results are displayed) Labs Reviewed  BASIC METABOLIC PANEL - Abnormal; Notable for the following components:      Result Value   Glucose, Bld 124 (*)    All other components within normal limits  CBC - Abnormal; Notable for the following components:   RBC 3.82 (*)    Hemoglobin 9.9 (*)    HCT 32.2 (*)    MCH 25.9 (*)    All other components within normal limits  I-STAT TROPONIN, ED  I-STAT BETA HCG BLOOD, ED ([facility], [facility], [facility] ONLY)  I-STAT TROPONIN, ED                                                                                                                          EKG  EKG Interpretation  Date/Time:  Friday July 28 2018 23:26:56 EST Ventricular Rate:  68 PR Interval:  164 QRS Duration: 92 QT Interval:  396 QTC Calculation: 421 R Axis:   75 Text Interpretation:  Normal sinus rhythm Normal ECG U waves present No significant change since last tracing Confirmed by Addison Lank [phone]) on 07/29/2018 4:18:33 AM      Radiology Dg Chest 2 View  Result Date: 07/28/2018 CLINICAL DATA:  38 year old female with chest pain. EXAM: CHEST - 2 VIEW COMPARISON:  Chest radiograph dated 02/27/2010 FINDINGS: The heart size and mediastinal contours are within normal limits. Both lungs are clear. The visualized skeletal structures are unremarkable. IMPRESSION: No active cardiopulmonary disease. Electronically Signed   By: Anner Crete M.D.   On: 07/28/2018 23:47   Pertinent labs & imaging results that were available during my care of the patient were reviewed by me and considered in my medical decision making (see chart for details).  Medications Ordered in ED Medications  sodium chloride flush (NS) 0.9 % injection 3 mL (3 mLs Intravenous Not Given 07/29/18 0437)  alum & mag hydroxide-simeth (MAALOX/MYLANTA) 200-200-20 MG/5ML suspension 30 mL (30 mLs Oral Given 07/29/18 0556)                                                                                                                                    Procedures Procedures  (including critical care time)  Medical Decision Making / ED Course I have reviewed the nursing notes for this encounter and the patient's prior records (if available in EHR or on provided paperwork).    Patient with 6 hours of left-sided chest pain, nonexertional and nonradiating.  Highly atypical for ACS.  EKG without acute ischemic changes or evidence of pericarditis.  Initial troponin negative.  Heart score less than 3.   Appropriate for delta troponin, which was negative.  Low suspicion for pulmonary embolism and patient is PERC negative.  Presentation not classic for aortic dissection or esophageal perforation.  Chest x-ray without evidence suggestive of pneumonia, pneumothorax, pneumomediastinum.  No abnormal contour of the mediastinum to suggest dissection. No evidence of acute injuries.  Labs without leukocytosis.  Stable hemoglobin from iron  deficiency anemia.  No significant electrolyte derangements or renal insufficiency.  Patient did not have any chest wall tenderness on exam, doubt MSK.  Provided with GI cocktail resulting in significant improvement in her symptomatology.  Likely the cause of her etiology.  The patient appears reasonably screened and/or stabilized for discharge and I doubt any other medical condition or other [facility] requiring further screening, evaluation, or treatment in the ED at this time prior to discharge.  The patient is safe for discharge with strict return precautions.   Final Clinical Impression(s) / ED Diagnoses Final diagnoses:  Atypical chest pain    Disposition: Discharge  Condition: Good  I have discussed the results, Dx and Tx plan with the patient who expressed understanding and agree(s) with the plan. Discharge instructions discussed at great length. The patient was given strict return precautions who verbalized understanding of the instructions. No further questions at time of discharge.    ED Discharge Orders    None       Follow Up: Fayrene Helper, MD [address], [place] [address] [phone]  Schedule an appointment as soon as possible for a visit  If symptoms do not improve or  worsen     This chart was dictated using voice recognition software.  Despite best efforts to proofread,  errors can occur which can change the documentation meaning.   Fatima Blank, MD 07/29/18 [phone]

## 2018-08-23 ENCOUNTER — Ambulatory Visit: Payer: Commercial Managed Care - PPO | Admitting: Family Medicine

## 2018-10-17 ENCOUNTER — Encounter: Payer: Self-pay | Admitting: Family Medicine

## 2018-10-18 ENCOUNTER — Ambulatory Visit (INDEPENDENT_AMBULATORY_CARE_PROVIDER_SITE_OTHER): Payer: Commercial Managed Care - PPO | Admitting: Family Medicine

## 2018-10-18 ENCOUNTER — Encounter: Payer: Self-pay | Admitting: Family Medicine

## 2018-10-18 VITALS — BP 134/64 | Ht 63.0 in | Wt 141.0 lb

## 2018-10-18 DIAGNOSIS — I10 Essential (primary) hypertension: Secondary | ICD-10-CM

## 2018-10-18 DIAGNOSIS — Z7189 Other specified counseling: Secondary | ICD-10-CM

## 2018-10-18 DIAGNOSIS — Z13220 Encounter for screening for lipoid disorders: Secondary | ICD-10-CM | POA: Diagnosis not present

## 2018-10-18 DIAGNOSIS — D509 Iron deficiency anemia, unspecified: Secondary | ICD-10-CM | POA: Diagnosis not present

## 2018-10-18 DIAGNOSIS — N926 Irregular menstruation, unspecified: Secondary | ICD-10-CM

## 2018-10-18 DIAGNOSIS — E559 Vitamin D deficiency, unspecified: Secondary | ICD-10-CM

## 2018-10-18 MED ORDER — AMLODIPINE BESYLATE 10 MG PO TABS: 10.0000 mg | ORAL_TABLET | Freq: Every day | ORAL | 1 refills | Status: DC

## 2018-10-18 NOTE — Patient Instructions (Addendum)
F/U in office in early October with MD, flu vaccine at that visit, call if you need  Me sooner  Need to take iron tablets daily as well as your blood pressure medication once daily  Tip sheet on coping with inbound status is being mailed, overall you seem to be  Doing  Well, so be encouraged!  Please get fasting CBC,lipid, iron and ferritin and cmp  and EGFR, TSH and vit D level a t [facility],( [place]) request is mailed, as soon as possible  Please call and schedule your pap saw e discussed  With your Gyne Doc  It is important that you exercise regularly at least 30 minutes 5 times a week. If you develop chest pain, have severe difficulty breathing, or feel very tired, stop exercising immediately and seek medical attention  Thanks for choosing [facility], we consider it a privelige to serve you.   Social distancing. Frequent hand washing with soap and water Keeping your hands off of your face.Wear a face mask and maintain 6 5 ft distance  These 3 practices will help to keep both you and your community healthy during this time. Please practice them faithfully!  

## 2018-10-21 ENCOUNTER — Encounter: Payer: Self-pay | Admitting: Family Medicine

## 2018-10-21 DIAGNOSIS — Z7189 Other specified counseling: Secondary | ICD-10-CM | POA: Insufficient documentation

## 2018-10-21 NOTE — Assessment & Plan Note (Signed)
Controlled, no change in medication DASH diet and commitment to daily physical activity for a minimum of 30 minutes discussed and encouraged, as a part of hypertension management. The importance of attaining a healthy weight is also discussed.  BP/Weight 10/18/2018 07/29/2018 07/28/2018 07/07/2018 01/18/2018 06/08/2017 43/20/0379  Systolic BP 444 619 - 012 224 114 643  Diastolic BP 64 64 - 71 64 80 80  Wt. (Lbs) 141 - 140 138 141.04 144.12 144.12  BMI 24.98 - 24.8 24.45 24.98 25.53 25.53

## 2018-10-21 NOTE — Assessment & Plan Note (Signed)
Covid-19 Education  The signs and symptoms of of COVID -19 were discussed with the patient and how to seek care for testing. ( follow up with PCP or arrange  E-visit) The importance of social  distancing is discussed today.  

## 2018-10-21 NOTE — Assessment & Plan Note (Signed)
Inconsistent in taking supplement despite heavy cycles Updated lab needed at/ before next visit.

## 2018-10-21 NOTE — Progress Notes (Signed)
Virtual Visit via Telephone Note  I connected with [name] on 10/21/18 at  3:40 PM EDT by telephone and verified that I am speaking with the correct person using two identifiers.  Location: Patient: home Provider: office   I discussed the limitations, risks, security and privacy concerns of performing an evaluation and management service by telephone and the availability of in person appointments. I also discussed with the patient that there may be a patient responsible charge related to this service. The patient expressed understanding and agreed to proceed.  This visit type is conducted due to national recommendations for restrictions regarding the COVID -19 Pandemic. Due to the patient's age and / or co morbidities, this format is felt to be most appropriate at this time without adequate follow up. The patient has no access to video technology/ had technical difficulties with video, requiring transitioning to audio format  only ( telephone ). All issues noted this document were discussed and addressed,no physical exam can be performed in this format.    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 Denies sinus pressure, nasal congestion, ear pain or sore throat. Denies chest congestion, productive cough or wheezing. Denies chest pains, palpitations and leg swelling Denies abdominal pain, nausea, vomiting,diarrhea or constipation.   Denies dysuria, frequency, hesitancy or incontinence. Denies joint pain, swelling and limitation in mobility. Denies headaches, seizures, numbness, or tingling.  Denies skin break down or rash.        Observations/Objective: BP 134/64   Ht 5\' 3"  (1.6 m)   Wt 141 lb (64 kg)   BMI 24.98 kg/m   Good communication with no confusion and intact memory. Alert and oriented x 3 No signs of respiratory distress during sppech    Assessment and Plan: HTN (hypertension) Controlled, no change in medication DASH diet and commitment to daily physical activity for a minimum of 30 minutes discussed and encouraged, as a part of hypertension management. The importance of attaining a healthy weight is also discussed.  BP/Weight 10/18/2018 07/29/2018 07/28/2018 07/07/2018 01/18/2018 06/08/2017 67/89/3810  Systolic BP 175 102 - 585 277 824 235  Diastolic BP 64 64 - 71 64 80 80  Wt. (Lbs) 141 - 140 138 141.04 144.12 144.12  BMI 24.98 - 24.8 24.45 24.98 25.53 25.53       Prolonged periods Ongoing, no decision taken re definitive management  Vitamin d deficiency Updated lab needed at/ before next visit.   IDA (iron deficiency anemia) Inconsistent in taking supplement despite heavy cycles Updated lab needed at/ before next visit.   Educated About Covid-19 Virus Infection Covid-19 Education  The signs and symptoms of of COVID -19 were discussed with the patient and how to seek care for testing. ( follow up with PCP or arrange  E-visit) The importance of social  distancing is discussed today.     Follow Up Instructions:    I discussed the assessment and treatment plan with the patient. The patient was provided an opportunity to ask questions and all were answered. The patient agreed with the plan and demonstrated an understanding of the instructions.   The patient was advised to call back or seek an  in-person evaluation if the symptoms worsen or if the condition fails to improve as anticipated.  I provided  22 minutes of non-face-to-face time during this encounter.   Tula Nakayama, MD

## 2018-10-21 NOTE — Assessment & Plan Note (Signed)
Updated lab needed at/ before next visit.   

## 2018-10-21 NOTE — Assessment & Plan Note (Signed)
Ongoing, no decision taken re definitive management

## 2018-10-27 LAB — TSH: TSH: 0.876 u[IU]/mL (ref 0.450–4.500)

## 2018-10-27 LAB — CBC
Hematocrit: 32.7 % — ABNORMAL LOW (ref 34.0–46.6)
Hemoglobin: 10.7 g/dL — ABNORMAL LOW (ref 11.1–15.9)
MCH: 26.3 pg — ABNORMAL LOW (ref 26.6–33.0)
MCHC: 32.7 g/dL (ref 31.5–35.7)
MCV: 80 fL (ref 79–97)
Platelets: 345 10*3/uL (ref 150–450)
RBC: 4.07 x10E6/uL (ref 3.77–5.28)
RDW: 14.8 % (ref 11.7–15.4)
WBC: 6.6 10*3/uL (ref 3.4–10.8)

## 2018-10-27 LAB — CMP14+EGFR
ALT: 15 IU/L (ref 0–32)
AST: 31 IU/L (ref 0–40)
Albumin/Globulin Ratio: 1.6 (ref 1.2–2.2)
Albumin: 4.7 g/dL (ref 3.8–4.8)
Alkaline Phosphatase: 77 IU/L (ref 39–117)
BUN/Creatinine Ratio: 18 (ref 9–23)
BUN: 16 mg/dL (ref 6–20)
Bilirubin Total: <0.2 mg/dL (ref 0.0–1.2)
CO2: 17 mmol/L — ABNORMAL LOW (ref 20–29)
Calcium: 9.4 mg/dL (ref 8.7–10.2)
Chloride: 102 mmol/L (ref 96–106)
Creatinine, Ser: 0.89 mg/dL (ref 0.57–1.00)
GFR calc Af Amer: 96 mL/min/{1.73_m2} (ref >59)
GFR calc non Af Amer: 83 mL/min/{1.73_m2} (ref >59)
Globulin, Total: 3 g/dL (ref 1.5–4.5)
Glucose: 93 mg/dL (ref 65–99)
Potassium: 4.7 mmol/L (ref 3.5–5.2)
Sodium: 137 mmol/L (ref 134–144)
Total Protein: 7.7 g/dL (ref 6.0–8.5)

## 2018-10-27 LAB — FERRITIN: Ferritin: 12 ng/mL — ABNORMAL LOW (ref 15–150)

## 2018-10-27 LAB — IRON: Iron: 27 ug/dL (ref 27–159)

## 2018-10-27 LAB — VITAMIN D 25 HYDROXY (VIT D DEFICIENCY, FRACTURES): Vit D, 25-Hydroxy: 11 ng/mL — ABNORMAL LOW (ref 30.0–100.0)

## 2018-10-27 LAB — LIPID PANEL
Chol/HDL Ratio: 3.4 ratio (ref 0.0–4.4)
Cholesterol, Total: 202 mg/dL — ABNORMAL HIGH (ref 100–199)
HDL: 59 mg/dL (ref >39)
LDL Calculated: 130 mg/dL — ABNORMAL HIGH (ref 0–99)
Triglycerides: 65 mg/dL (ref 0–149)
VLDL Cholesterol Cal: 13 mg/dL (ref 5–40)

## 2018-10-29 ENCOUNTER — Encounter: Payer: Self-pay | Admitting: Family Medicine

## 2018-10-29 ENCOUNTER — Other Ambulatory Visit: Payer: Self-pay | Admitting: Family Medicine

## 2018-10-29 MED ORDER — ERGOCALCIFEROL 1.25 MG (50000 UT) PO CAPS: 50000.0000 [IU] | ORAL_CAPSULE | ORAL | 1 refills | Status: DC

## 2019-01-19 ENCOUNTER — Encounter: Payer: Self-pay | Admitting: Family Medicine

## 2019-01-22 ENCOUNTER — Telehealth: Payer: Self-pay

## 2019-01-22 NOTE — Telephone Encounter (Signed)
-----   Message from Fayrene Helper, MD sent at 01/22/2019  8:56 AM EDT ----- Regarding: pls see pt message , needs letter for high risk status , thank you

## 2019-01-22 NOTE — Telephone Encounter (Signed)
Letter written

## 2019-03-14 ENCOUNTER — Ambulatory Visit: Payer: Commercial Managed Care - PPO | Admitting: Family Medicine

## 2020-06-04 ENCOUNTER — Other Ambulatory Visit (HOSPITAL_COMMUNITY): Payer: Self-pay | Admitting: *Deleted

## 2020-06-04 ENCOUNTER — Other Ambulatory Visit (HOSPITAL_COMMUNITY): Payer: Self-pay | Admitting: Obstetrics and Gynecology

## 2020-06-04 DIAGNOSIS — Z1231 Encounter for screening mammogram for malignant neoplasm of breast: Secondary | ICD-10-CM

## 2020-06-19 ENCOUNTER — Other Ambulatory Visit: Payer: Self-pay | Admitting: Obstetrics and Gynecology

## 2020-06-19 DIAGNOSIS — N6320 Unspecified lump in the left breast, unspecified quadrant: Secondary | ICD-10-CM

## 2020-06-20 ENCOUNTER — Inpatient Hospital Stay (HOSPITAL_COMMUNITY): Admission: RE | Admit: 2020-06-20 | Payer: Self-pay | Source: Ambulatory Visit

## 2020-06-20 ENCOUNTER — Other Ambulatory Visit (HOSPITAL_COMMUNITY): Payer: Self-pay | Admitting: Obstetrics and Gynecology

## 2020-06-20 DIAGNOSIS — N6320 Unspecified lump in the left breast, unspecified quadrant: Secondary | ICD-10-CM

## 2020-06-23 ENCOUNTER — Ambulatory Visit (HOSPITAL_COMMUNITY): Payer: Commercial Managed Care - PPO

## 2020-07-01 ENCOUNTER — Other Ambulatory Visit (HOSPITAL_COMMUNITY): Payer: Self-pay | Admitting: Obstetrics and Gynecology

## 2020-07-01 ENCOUNTER — Telehealth: Payer: Self-pay

## 2020-07-01 ENCOUNTER — Other Ambulatory Visit (HOSPITAL_COMMUNITY): Payer: Self-pay

## 2020-07-01 ENCOUNTER — Inpatient Hospital Stay (HOSPITAL_COMMUNITY): Admission: RE | Admit: 2020-07-01 | Payer: Self-pay | Source: Ambulatory Visit

## 2020-07-01 ENCOUNTER — Other Ambulatory Visit: Payer: Self-pay

## 2020-07-01 ENCOUNTER — Ambulatory Visit (HOSPITAL_COMMUNITY)
Admission: RE | Admit: 2020-07-01 | Discharge: 2020-07-01 | Disposition: A | Discharge status: Home / Self Care | Payer: Commercial Managed Care - PPO | Source: Ambulatory Visit | Attending: Obstetrics and Gynecology | Admitting: Obstetrics and Gynecology

## 2020-07-01 ENCOUNTER — Other Ambulatory Visit (HOSPITAL_COMMUNITY): Payer: Self-pay | Admitting: Family Medicine

## 2020-07-01 DIAGNOSIS — R928 Other abnormal and inconclusive findings on diagnostic imaging of breast: Secondary | ICD-10-CM

## 2020-07-01 DIAGNOSIS — N6320 Unspecified lump in the left breast, unspecified quadrant: Secondary | ICD-10-CM | POA: Diagnosis not present

## 2020-07-01 NOTE — Telephone Encounter (Signed)
I have sent the pt a mychart msg, as the pts voicemail was full.  Thanks

## 2020-07-01 NOTE — Telephone Encounter (Signed)
-----  Message from Fayrene Helper, MD sent at 07/01/2020 11:13 AM EST ----- Regarding: needs visit and fasting labs pls attempt to sched Pls order if she sched fasting CBC, lipid, cmp and EGFr, hepatitis C screen, TSH and vit D Would like video visit with her

## 2020-07-07 ENCOUNTER — Other Ambulatory Visit: Payer: Self-pay

## 2020-07-07 DIAGNOSIS — Z1159 Encounter for screening for other viral diseases: Secondary | ICD-10-CM

## 2020-07-07 DIAGNOSIS — E559 Vitamin D deficiency, unspecified: Secondary | ICD-10-CM

## 2020-07-07 DIAGNOSIS — I10 Essential (primary) hypertension: Secondary | ICD-10-CM

## 2020-07-07 DIAGNOSIS — Z13220 Encounter for screening for lipoid disorders: Secondary | ICD-10-CM

## 2020-07-08 ENCOUNTER — Other Ambulatory Visit (HOSPITAL_COMMUNITY): Payer: Self-pay | Admitting: Family Medicine

## 2020-07-08 ENCOUNTER — Other Ambulatory Visit: Payer: Self-pay

## 2020-07-08 ENCOUNTER — Ambulatory Visit (HOSPITAL_COMMUNITY)
Admission: RE | Admit: 2020-07-08 | Discharge: 2020-07-08 | Disposition: A | Discharge status: Home / Self Care | Payer: Commercial Managed Care - PPO | Source: Ambulatory Visit | Attending: Family Medicine | Admitting: Family Medicine

## 2020-07-08 ENCOUNTER — Encounter (HOSPITAL_COMMUNITY): Payer: Self-pay

## 2020-07-08 DIAGNOSIS — R928 Other abnormal and inconclusive findings on diagnostic imaging of breast: Secondary | ICD-10-CM

## 2020-07-08 DIAGNOSIS — D242 Benign neoplasm of left breast: Secondary | ICD-10-CM | POA: Insufficient documentation

## 2020-07-08 MED ORDER — LIDOCAINE-EPINEPHRINE (PF) 1 %-1:200000 IJ SOLN
INTRAMUSCULAR | Status: AC
  Administered 2020-07-08: 10 mL
  Filled 2020-07-08: qty 30

## 2020-07-08 MED ORDER — LIDOCAINE HCL (PF) 2 % IJ SOLN
INTRAMUSCULAR | Status: AC
  Administered 2020-07-08: 10 mL
  Filled 2020-07-08: qty 10

## 2020-07-08 NOTE — Discharge Instructions (Signed)
Breast Biopsy, Care After °These instructions give you information about caring for yourself after your procedure. Your doctor may also give you more specific instructions. Call your doctor if you have any problems or questions after your procedure. °What can I expect after the procedure? °After your procedure, it is common to have: °· Bruising on your breast. °· Numbness, tingling, or pain near your biopsy site. °Follow these instructions at home: °Medicines °· Take over-the-counter and prescription medicines only as told by your doctor. °· Do not drive for 24 hours if you were given a medicine to help you relax (sedative) during your procedure. °· Do not drink alcohol while taking pain medicine. °· Do not drive or use heavy machinery while taking prescription pain medicine. °Biopsy site care °· Follow instructions from your doctor about how to take care of your cut from surgery (incision) or your puncture area. Make sure you: °? Wash your hands with soap and water before you change your bandage (dressing). If you cannot use soap and water, use hand sanitizer. °? Change your bandage as told by your doctor. °? Leave stitches (sutures), skin glue, or skin tape (adhesive strips) in place. They may need to stay in place for 2 weeks or longer. If tape strips get loose and curl up, you may trim the loose edges. Do not remove tape strips completely unless your doctor says it is okay. °· If you have stitches, keep them dry when you take a bath or a shower. °· Check your cut or puncture area every day for signs of infection. Check for: °? Redness, swelling, or pain. °? Fluid or blood. °? Warmth. °? Pus or a bad smell. °· Protect the biopsy area. Do not let the area get bumped.  °  °  °Activity °· If you had a cut during your procedure, avoid activities that could pull your cut open. These include: °? Stretching. °? Reaching over your head. °? Exercise. °? Sports. °? Lifting anything that weighs more than 3 lb (1.4  kg). °· Return to your normal activities as told by your doctor. Ask your doctor what activities are safe for you. °Managing pain, stiffness, and swelling °If told, put ice on the biopsy site to relieve swelling: °· Put ice in a plastic bag. °· Place a towel between your skin and the bag. °· Leave the ice on for 20 minutes, 2-3 times a day. °General instructions °· Continue your normal diet. °· Wear a good support bra for as long as told by your doctor. °· Get checked for extra fluid around your lymph nodes (lymphedema) as often as told by your doctor. °· Keep all follow-up visits as told by your doctor. This is important. °Contact a doctor if: °· You notice any of the following at the biopsy site: °? More redness, swelling, or pain. °? More fluid or blood coming from the site. °? The site feels warm to the touch. °? Pus or a bad smell coming from the site. °? The site breaks open after the stitches or skin tape strips have been removed. °· You have a rash. °· You have a fever. °Get help right away if: °· You have more bleeding from the biopsy site. Get help right away if bleeding is more than a small spot. °· You have trouble breathing. °· You have red streaks around the biopsy site. °Summary °· After your procedure, it is common to have bruising, numbness, tingling, or pain near the biopsy site. °· Do not   drive or use heavy machinery while taking prescription pain medicine. °· Wear a good support bra for as long as told by your doctor. °· If you had a cut during your procedure, avoid activities that may pull the cut open. Ask your doctor what activities are safe for you. °This information is not intended to replace advice given to you by your health care provider. Make sure you discuss any questions you have with your health care provider. °Document Revised: 02/04/2020 Document Reviewed: 11/10/2017 °Elsevier Patient Education © 2021 [employer] ° °

## 2020-07-08 NOTE — Sedation Documentation (Signed)
PT tolerated left breast biopsy well today with NAD noted. PT verbalized understanding of discharge instructions and given paper copy. PT ambulated back to the mammogram area this time.

## 2020-07-09 LAB — LIPID PANEL
Chol/HDL Ratio: 3.9 ratio (ref 0.0–4.4)
Cholesterol, Total: 219 mg/dL — ABNORMAL HIGH (ref 100–199)
HDL: 56 mg/dL (ref >39)
LDL Chol Calc (NIH): 147 mg/dL — ABNORMAL HIGH (ref 0–99)
Triglycerides: 91 mg/dL (ref 0–149)
VLDL Cholesterol Cal: 16 mg/dL (ref 5–40)

## 2020-07-09 LAB — CBC
Hematocrit: 34 % (ref 34.0–46.6)
Hemoglobin: 9.8 g/dL — ABNORMAL LOW (ref 11.1–15.9)
MCH: 20.8 pg — ABNORMAL LOW (ref 26.6–33.0)
MCHC: 28.8 g/dL — ABNORMAL LOW (ref 31.5–35.7)
MCV: 72 fL — ABNORMAL LOW (ref 79–97)
Platelets: 397 10*3/uL (ref 150–450)
RBC: 4.71 x10E6/uL (ref 3.77–5.28)
RDW: 16.1 % — ABNORMAL HIGH (ref 11.7–15.4)
WBC: 6.9 10*3/uL (ref 3.4–10.8)

## 2020-07-09 LAB — CMP14+EGFR
ALT: 12 IU/L (ref 0–32)
AST: 16 IU/L (ref 0–40)
Albumin/Globulin Ratio: 1.4 (ref 1.2–2.2)
Albumin: 4.6 g/dL (ref 3.8–4.8)
Alkaline Phosphatase: 76 IU/L (ref 44–121)
BUN/Creatinine Ratio: 16 (ref 9–23)
BUN: 14 mg/dL (ref 6–20)
Bilirubin Total: 0.3 mg/dL (ref 0.0–1.2)
CO2: 21 mmol/L (ref 20–29)
Calcium: 9.4 mg/dL (ref 8.7–10.2)
Chloride: 103 mmol/L (ref 96–106)
Creatinine, Ser: 0.86 mg/dL (ref 0.57–1.00)
GFR calc Af Amer: 98 mL/min/{1.73_m2} (ref >59)
GFR calc non Af Amer: 85 mL/min/{1.73_m2} (ref >59)
Globulin, Total: 3.3 g/dL (ref 1.5–4.5)
Glucose: 94 mg/dL (ref 65–99)
Potassium: 4.1 mmol/L (ref 3.5–5.2)
Sodium: 137 mmol/L (ref 134–144)
Total Protein: 7.9 g/dL (ref 6.0–8.5)

## 2020-07-09 LAB — TSH: TSH: 0.378 u[IU]/mL — ABNORMAL LOW (ref 0.450–4.500)

## 2020-07-09 LAB — HEPATITIS C ANTIBODY: Hep C Virus Ab: <0.1 s/co ratio (ref 0.0–0.9)

## 2020-07-09 LAB — VITAMIN D 25 HYDROXY (VIT D DEFICIENCY, FRACTURES): Vit D, 25-Hydroxy: 13.1 ng/mL — ABNORMAL LOW (ref 30.0–100.0)

## 2020-07-09 LAB — SURGICAL PATHOLOGY

## 2020-07-11 LAB — FOLATE: Folate: 5.3 ng/mL (ref >3.0)

## 2020-07-11 LAB — SPECIMEN STATUS REPORT

## 2020-07-11 LAB — TSH+FREE T4
Free T4: 0.91 ng/dL (ref 0.82–1.77)
TSH: 0.378 u[IU]/mL — ABNORMAL LOW (ref 0.450–4.500)

## 2020-07-11 LAB — IRON: Iron: 20 ug/dL — ABNORMAL LOW (ref 27–159)

## 2020-07-11 LAB — T3, FREE: T3, Free: 2.8 pg/mL (ref 2.0–4.4)

## 2020-07-11 LAB — VITAMIN B12: Vitamin B-12: 438 pg/mL (ref 232–1245)

## 2020-07-11 LAB — FERRITIN: Ferritin: 7 ng/mL — ABNORMAL LOW (ref 15–150)

## 2021-01-23 ENCOUNTER — Other Ambulatory Visit: Payer: Self-pay

## 2021-01-23 ENCOUNTER — Telehealth: Payer: Self-pay

## 2021-01-23 DIAGNOSIS — I10 Essential (primary) hypertension: Secondary | ICD-10-CM

## 2021-01-23 MED ORDER — AMLODIPINE BESYLATE 10 MG PO TABS: 10.0000 mg | ORAL_TABLET | Freq: Every day | ORAL | 1 refills | Status: DC

## 2021-01-23 NOTE — Telephone Encounter (Signed)
Patient needs refill on amlodipine '10mg'$  send to [facility] on [address] ph# [phone]

## 2021-01-23 NOTE — Telephone Encounter (Signed)
Rx refilled.

## 2021-03-10 IMAGING — US US BREAST BX W LOC DEV 1ST LESION IMG BX SPEC US GUIDE*L*
1 series · 12 of 13 positions shown · non-contrast
Comparison: Previous exam(s).
COMPARISON: Previous exam(s).

Addendum:
CLINICAL DATA: 39-year-old female with an indeterminate mass in the
left breast at the 1 o'clock position.

EXAM:
ULTRASOUND GUIDED LEFT BREAST CORE NEEDLE BIOPSY

[Series 1: us breast bx w loc dev 1st lesion img bx spec us g · 0.07mm/px · 12 of 13 slices shown]
[im 1/13]
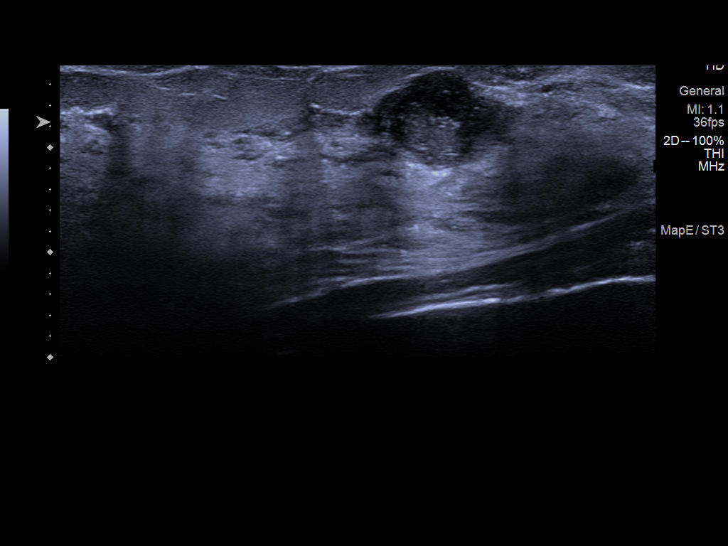
[im 2/13]
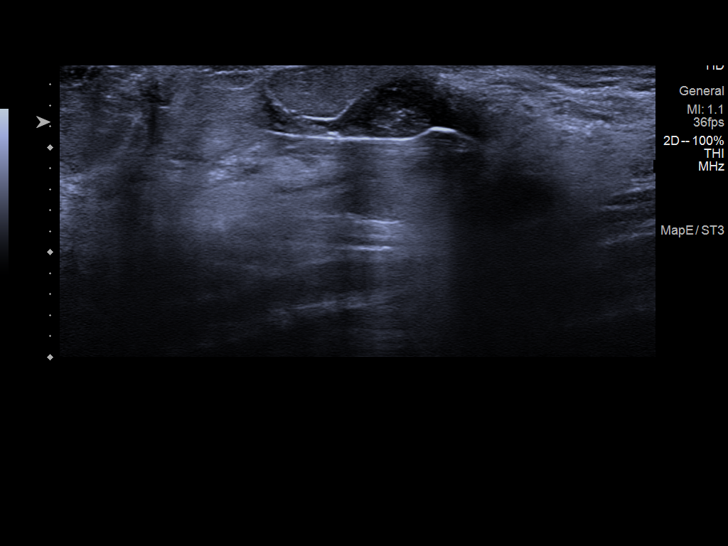
[im 3/13]
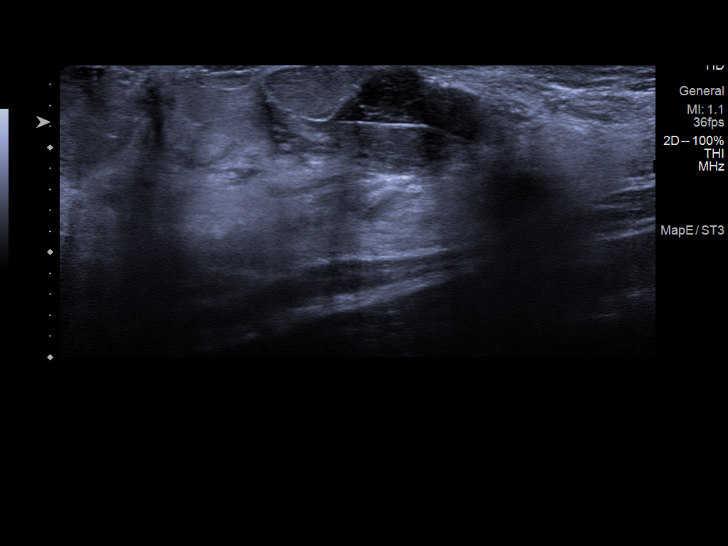
[im 4/13]
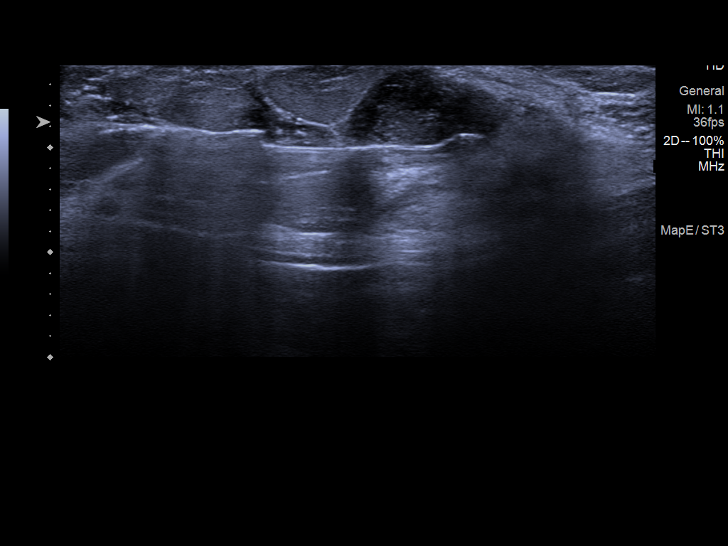
[im 5/13]
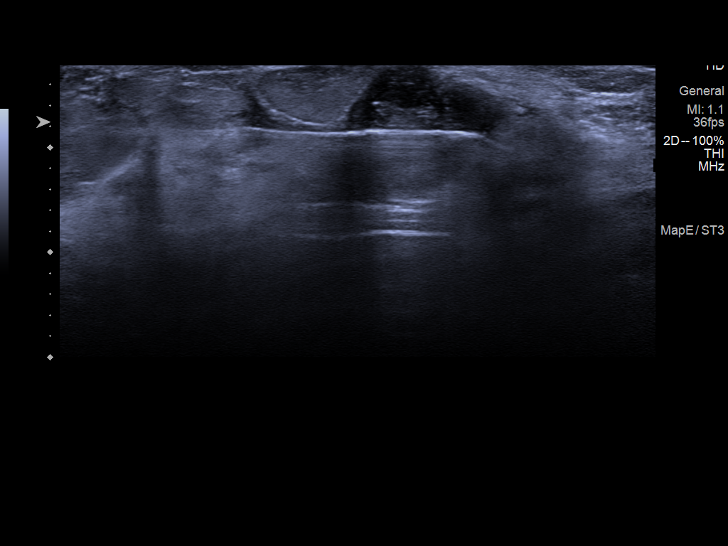
[im 6/13]
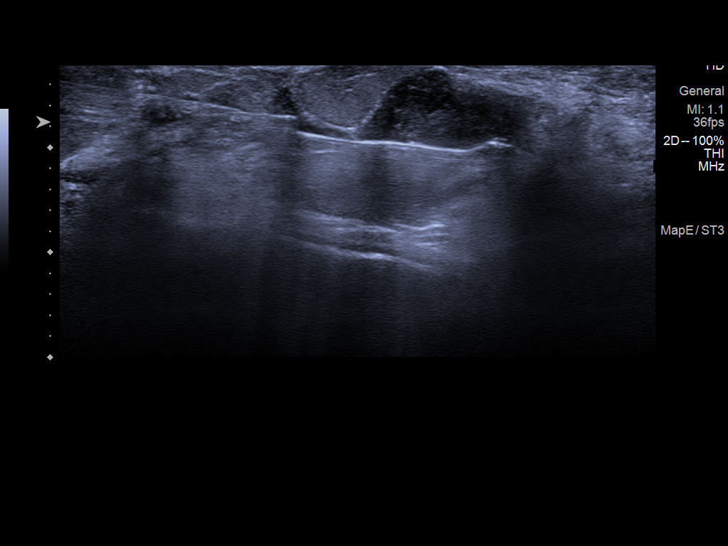
[im 8/13]
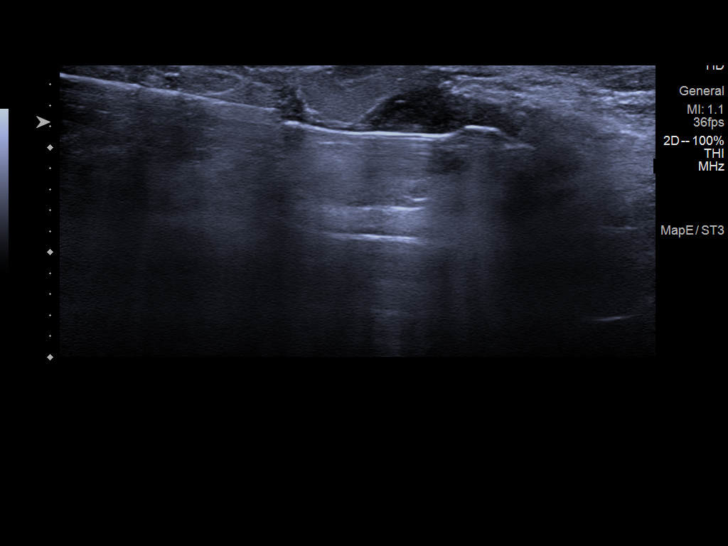
[im 9/13]
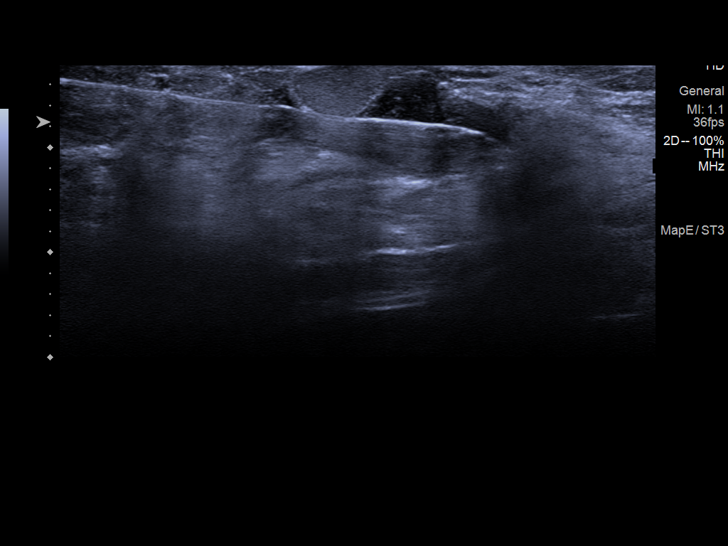
[im 10/13]
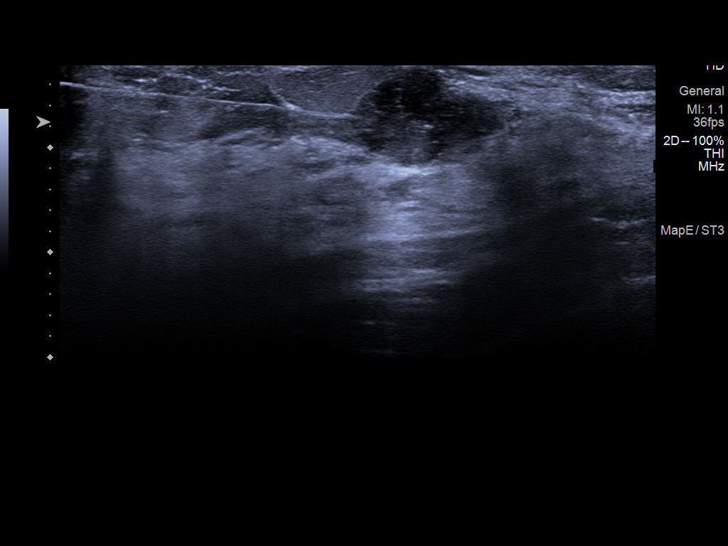
[im 11/13]
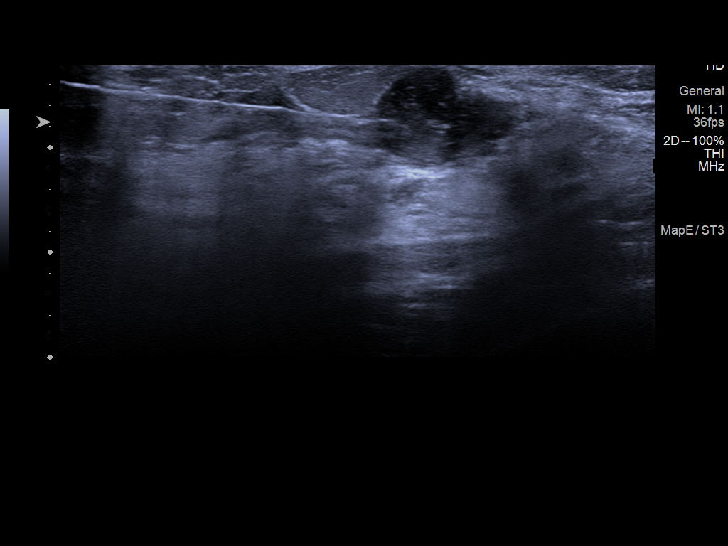
[im 12/13]
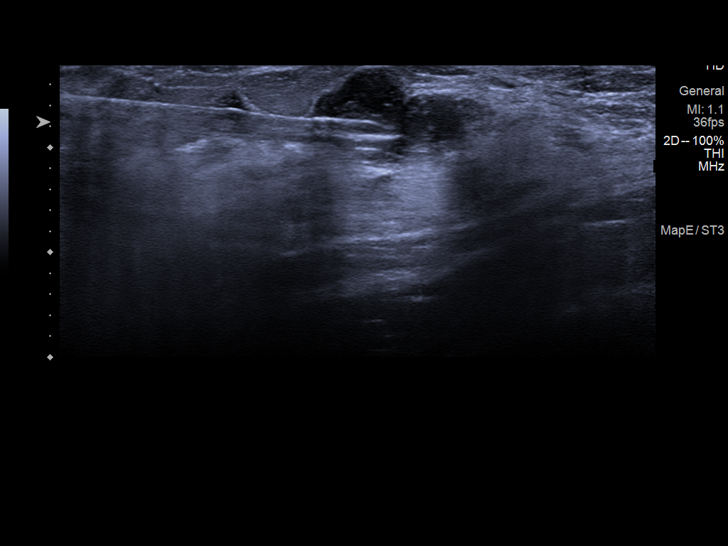
[im 13/13]
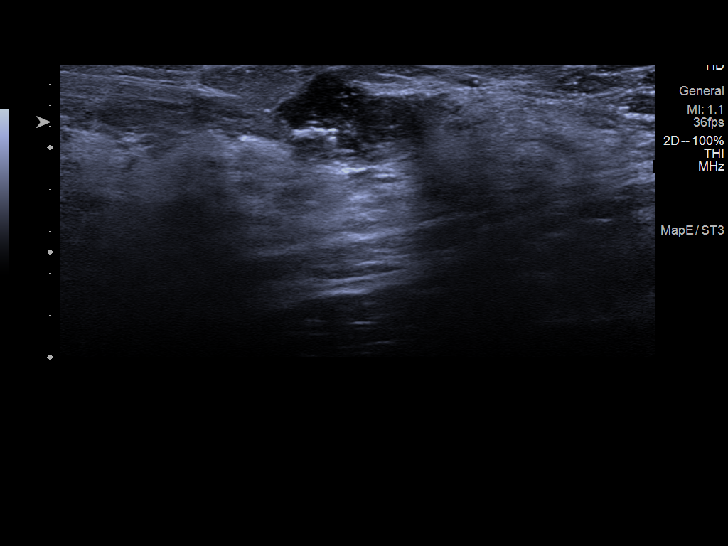

[12 of 13 positions shown; findings below may reference images not displayed]



Lesion quadrant: Upper-outer

Using sterile technique and 1% Lidocaine as local anesthetic, under
direct ultrasound visualization, a 14 gauge Zabrina device was
used to perform biopsy of the mass in the left breast the 1 o'clock
position using a lateral to medial approach. At the conclusion of
the procedure a ribbon shaped tissue marker clip was deployed into
the biopsy cavity. Follow up 2 view mammogram was performed and
dictated separately.
IMPRESSION: Ultrasound guided biopsy of the mass in the left breast at the 1
o'clock position. No apparent complications.

ADDENDUM:
PATHOLOGY revealed: A. BREAST, [DATE], LEFT, BIOPSY: - Fibroadenoma.

Pathology results are CONCORDANT with imaging findings, per Dr.
Birgit Farez.

Pathology results and recommendations below were discussed with
patient by telephone on 07/09/2020. Patient reported biopsy site
within normal limits with slight tenderness at the site. Post biopsy
care instructions were reviewed, questions were answered and my
direct phone number was provided to patient. Patient was instructed
to call [HOSPITAL] [HOSPITAL] Mammography Department if
any concerns or questions arise related to the biopsy.

Recommendation: Begin annual bilateral screening mammograms due
June 2021.

Pathology results reported by Hollosy Barsi RN on 07/09/2020.



Lesion quadrant: Upper-outer

Using sterile technique and 1% Lidocaine as local anesthetic, under
direct ultrasound visualization, a 14 gauge Zabrina device was
used to perform biopsy of the mass in the left breast the 1 o'clock
position using a lateral to medial approach. At the conclusion of
the procedure a ribbon shaped tissue marker clip was deployed into
the biopsy cavity. Follow up 2 view mammogram was performed and
dictated separately.
IMPRESSION: Ultrasound guided biopsy of the mass in the left breast at the 1
o'clock position. No apparent complications.

## 2021-03-10 IMAGING — MG MM BREAST LOCALIZATION CLIP
2 series · 2 of 2 positions shown · non-contrast
Comparison: Previous exams.

CLINICAL DATA: Post ultrasound-guided biopsy of a palpable mass in
the left breast at the 1 position.

EXAM:
DIAGNOSTIC LEFT MAMMOGRAM POST ULTRASOUND BIOPSY

[L CC]
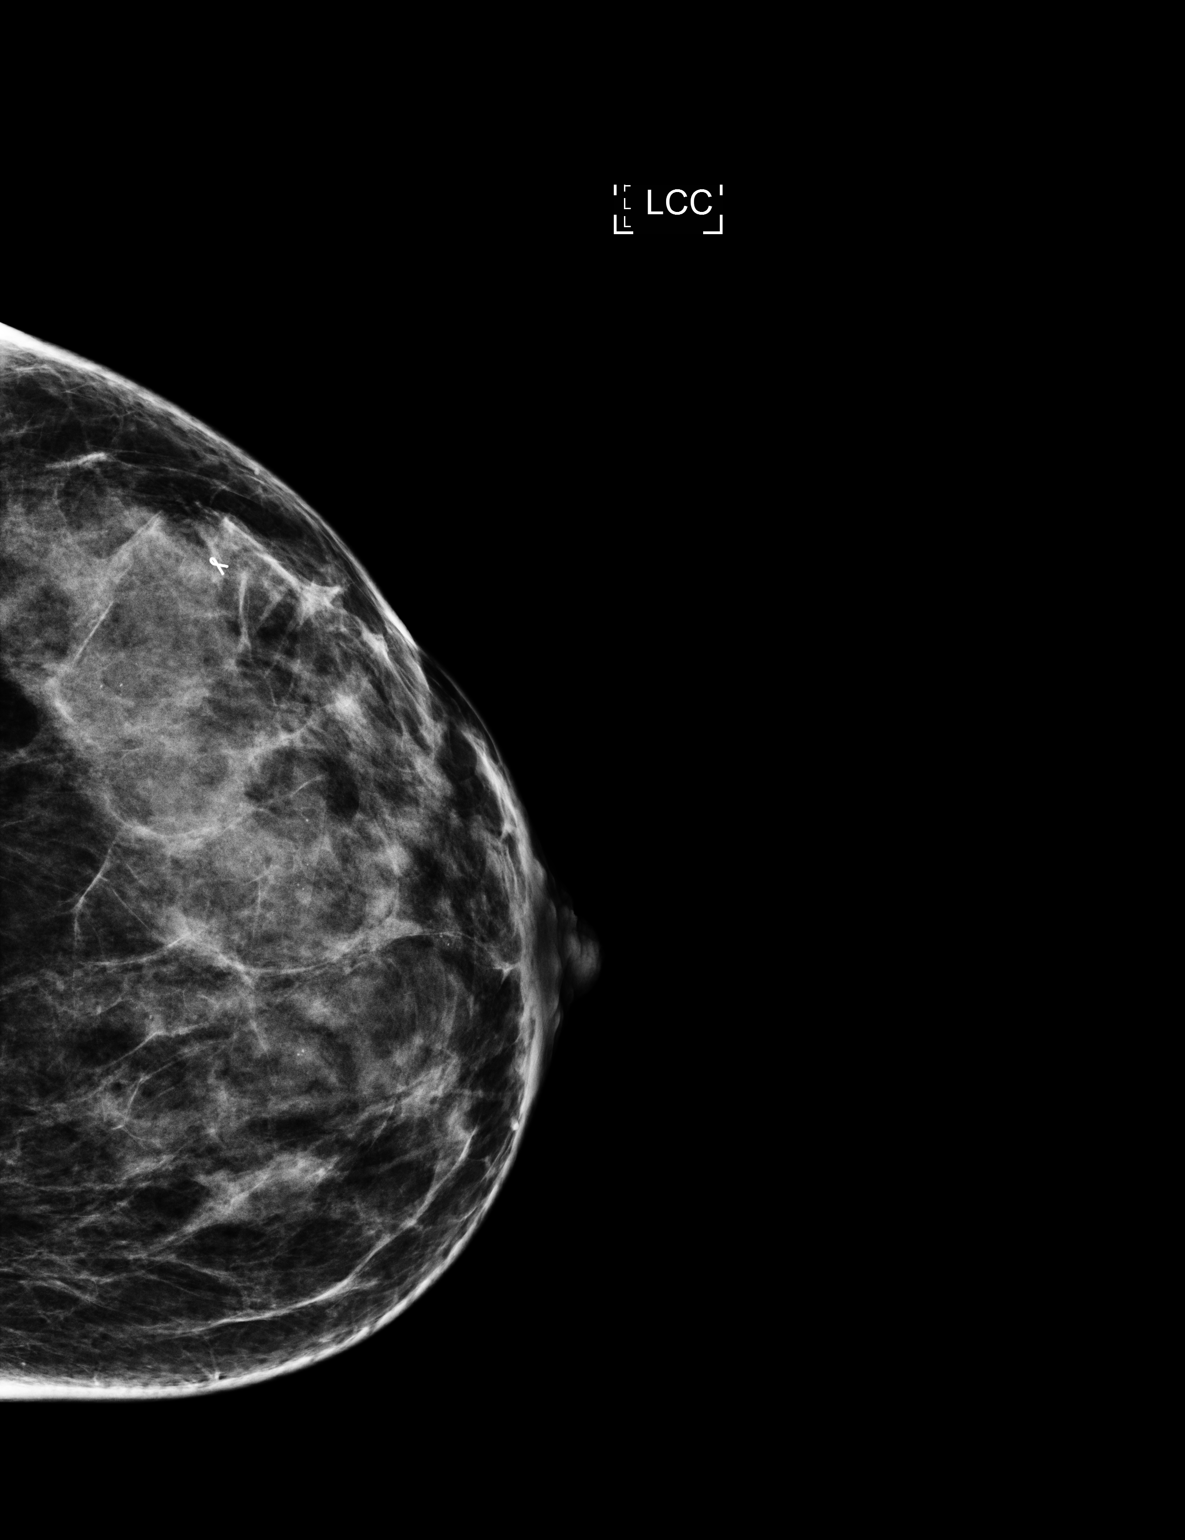

[L ML]
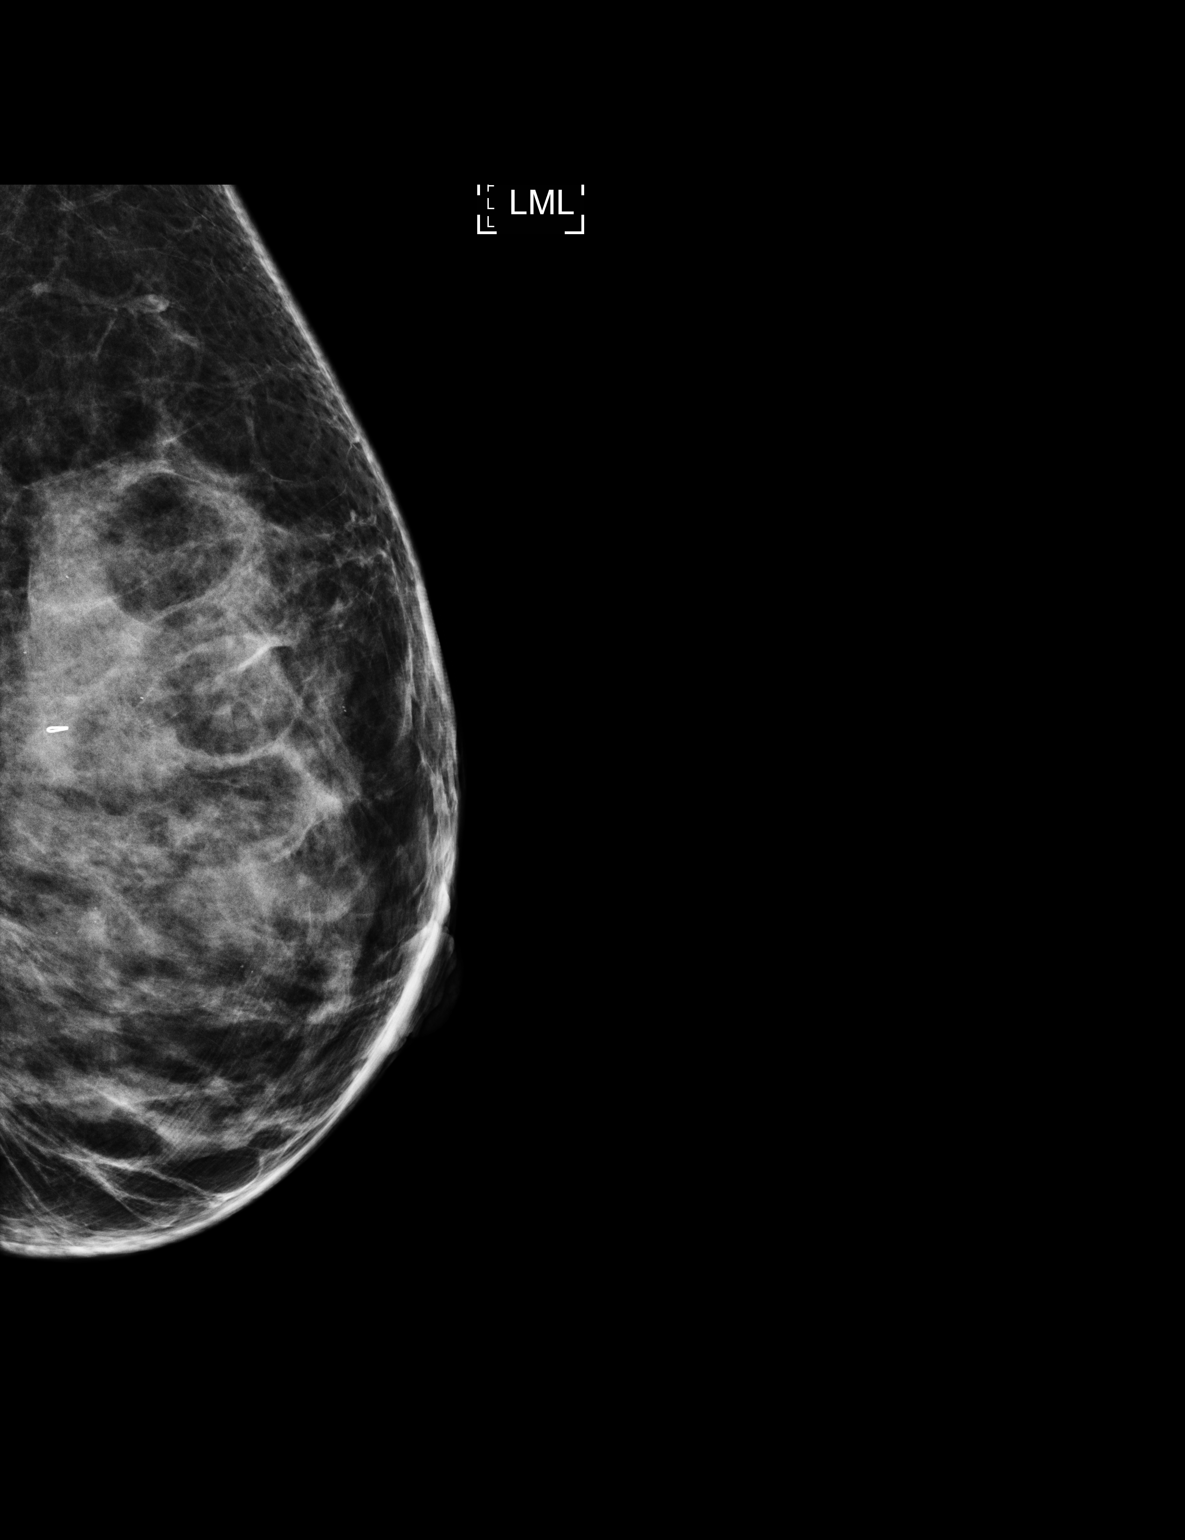

[2 of 2 positions shown; findings below may reference images not displayed]

FINDINGS: Mammographic images were obtained following ultrasound guided biopsy
of a mass in the left breast at the 1 o'clock position. A ribbon
shaped biopsy marking clip is present the site of the biopsied mass
in the left breast at the 1 o'clock position.
IMPRESSION: Ribbon shaped biopsy marking clip at site of biopsied mass in the
left breast at the 1 o'clock position.

Final Assessment: Post Procedure Mammograms for Marker Placement

## 2021-08-27 ENCOUNTER — Ambulatory Visit
Admission: RE | Admit: 2021-08-27 | Discharge: 2021-08-27 | Disposition: A | Discharge status: Home / Self Care | Payer: BC Managed Care – PPO | Source: Ambulatory Visit | Attending: Urgent Care | Admitting: Urgent Care

## 2021-08-27 ENCOUNTER — Other Ambulatory Visit: Payer: Self-pay

## 2021-08-27 VITALS — BP 149/80 | HR 102 | Temp 98.5°F | Resp 18 | Ht 63.0 in | Wt 148.0 lb

## 2021-08-27 DIAGNOSIS — J019 Acute sinusitis, unspecified: Secondary | ICD-10-CM

## 2021-08-27 DIAGNOSIS — R0981 Nasal congestion: Secondary | ICD-10-CM | POA: Diagnosis not present

## 2021-08-27 DIAGNOSIS — J309 Allergic rhinitis, unspecified: Secondary | ICD-10-CM

## 2021-08-27 MED ORDER — LEVOCETIRIZINE DIHYDROCHLORIDE 5 MG PO TABS: 5.0000 mg | ORAL_TABLET | Freq: Every evening | ORAL | 0 refills | Status: DC

## 2021-08-27 MED ORDER — DOXYCYCLINE HYCLATE 100 MG PO CAPS: 100.0000 mg | ORAL_CAPSULE | Freq: Two times a day (BID) | ORAL | 0 refills | Status: DC

## 2021-08-27 MED ORDER — PREDNISONE 20 MG PO TABS: ORAL_TABLET | ORAL | 0 refills | Status: DC

## 2021-08-27 NOTE — ED Triage Notes (Signed)
Pt reports cough, runny nose, congestion for last several days. Pt denies any known fevers and reports home covid test negative. Denies sore throat.  ?

## 2021-08-27 NOTE — ED Provider Notes (Signed)
?[facility] ? ? ?MRN: [id number] DOB: 1981-04-02 ? ?Subjective:  ? ?[name] is a 41 y.o. female presenting for 2-week history of persistent and worsening sinus congestion, sinus pressure, coughing, scratchy throat.  Patient has allergic rhinitis and takes Zyrtec daily.  No nasal sprays.  No chest pain, shortness of breath or wheezing.  No asthma.  No smoking. ? ?No current facility-administered medications for this encounter. ? ?Current Outpatient Medications:  ?  amLODipine (NORVASC) 10 MG tablet, Take 1 tablet (10 mg total) by mouth daily., Disp: 90 tablet, Rfl: 1 ?  ergocalciferol (VITAMIN D2) 1.25 MG (50000 UT) capsule, Take 1 capsule (50,000 Units total) by mouth once a week. One capsule once weekly, Disp: 12 capsule, Rfl: 1 ?  ferrous sulfate 325 (65 FE) MG EC tablet, Take twice a day with food (Patient taking differently: Take 325 mg by mouth daily with breakfast. Take twice a day with food), Disp: 60 tablet, Rfl: 3  ? ?Allergies  ?Allergen Reactions  ? Diflucan [Fluconazole]   ?  Rash   ? Penicillins Hives and Nausea And Vomiting  ? ? ?Past Medical History:  ?Diagnosis Date  ? Anxiety   ? Axillary abscess 2014  ? Left axillary abscess  ? Boil, thigh 05/2014  ? Right inner  ? Flu 06/25/2018  ? 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 Iron deficiency anemia   ? PONV (postoperative nausea and vomiting)   ? Pregnancy induced hypertension   ? Prolonged periods 02/28/2014  ? S/P tubal ligation 07/07/2018  ? Sebaceous cyst of left axilla 05/08/2017  ? Vitamin D deficiency   ? resolved  ? Wears glasses   ?  ? ?Past Surgical History:  ?Procedure Laterality Date  ? INCISE AND DRAIN ABCESS  2014  ? LAPAROSCOPIC BILATERAL SALPINGECTOMY Bilateral 07/07/2018  ? Procedure: LAPAROSCOPIC BILATERAL SALPINGECTOMY;  Surgeon: Janyth Contes, MD;  Location: [place];  Service: Gynecology;  Laterality: Bilateral;  MD request an RNFA  ? TUBAL LIGATION    ? ? ?Family History  ?Problem Relation Age of  Onset  ? Hypertension Mother   ? Hypertension Father   ? Diabetes Maternal Aunt   ? Diabetes Maternal Grandmother   ? Diabetes Maternal Grandfather   ? Hypertension Paternal Grandmother   ? ? ?Social History  ? ?Tobacco Use  ? Smoking status: Never  ? Smokeless tobacco: Never  ?Vaping Use  ? Vaping Use: Never used  ?Substance Use Topics  ? Alcohol use: No  ? Drug use: No  ? ? ?ROS ? ? ?Objective:  ? ?Vitals: ?BP (!) 149/80 (BP Location: Right Arm)   Pulse (!) 102   Temp 98.5 ?F (36.9 ?C) (Oral)   Resp 18   Ht '5\' 3"'$  (1.6 m)   Wt 148 lb (67.1 kg)   LMP 08/25/2021 (Approximate)   SpO2 98%   BMI 26.22 kg/m?  ? ?Physical Exam ?Constitutional:   ?   General: She is not in acute distress. ?   Appearance: Normal appearance. She is well-developed and normal weight. She is not ill-appearing, toxic-appearing or diaphoretic.  ?HENT:  ?   Head: Normocephalic and atraumatic.  ?   Right Ear: Tympanic membrane, ear canal and external ear normal. No drainage or tenderness. No middle ear effusion. There is no impacted cerumen. Tympanic membrane is not erythematous.  ?   Left Ear: Tympanic membrane, ear canal and external ear normal. No drainage or tenderness.  No middle ear effusion. There is no impacted  cerumen. Tympanic membrane is not erythematous.  ?   Nose: Congestion and rhinorrhea present.  ?   Mouth/Throat:  ?   Mouth: Mucous membranes are moist. No oral lesions.  ?   Pharynx: No pharyngeal swelling, oropharyngeal exudate, posterior oropharyngeal erythema or uvula swelling.  ?   Tonsils: No tonsillar exudate or tonsillar abscesses.  ?Eyes:  ?   General: No scleral icterus.    ?   Right eye: No discharge.     ?   Left eye: No discharge.  ?   Extraocular Movements: Extraocular movements intact.  ?   Right eye: Normal extraocular motion.  ?   Left eye: Normal extraocular motion.  ?   Conjunctiva/sclera: Conjunctivae normal.  ?Cardiovascular:  ?   Rate and Rhythm: Normal rate.  ?   Heart sounds: No murmur heard. ?  No  friction rub. No gallop.  ?Pulmonary:  ?   Effort: Pulmonary effort is normal. No respiratory distress.  ?   Breath sounds: No stridor. No wheezing, rhonchi or rales.  ?Chest:  ?   Chest wall: No tenderness.  ?Musculoskeletal:  ?   Cervical back: Normal range of motion and neck supple.  ?Lymphadenopathy:  ?   Cervical: No cervical adenopathy.  ?Skin: ?   General: Skin is warm and dry.  ?Neurological:  ?   General: No focal deficit present.  ?   Mental Status: She is alert and oriented to person, place, and time.  ?Psychiatric:     ?   Mood and Affect: Mood normal.     ?   Behavior: Behavior normal.     ?   Thought Content: Thought content normal.     ?   Judgment: Judgment normal.  ? ? ?Assessment and Plan :  ? ?PDMP not reviewed this encounter. ? ?1. Acute non-recurrent sinusitis, unspecified location   ?2. Sinus congestion   ?3. Allergic rhinitis, unspecified seasonality, unspecified trigger   ? ?Deferred imaging given clear cardiopulmonary exam, hemodynamically stable vital signs. Will start empiric treatment for sinusitis with doxycycline.  Recommended an oral prednisone course in the context of her history of allergic rhinitis.  Switch from Zyrtec to Xyzal, add Flonase daily when her sinusitis resolves.  Use pseudoephedrine as needed going forward once she completes the prednisone course.  Counseled patient on potential for adverse effects with medications prescribed/recommended today, ER and return-to-clinic precautions discussed, patient verbalized understanding. ? ?  ?Jaynee Eagles, PA-C ?08/27/21 1722 ? ?

## 2021-11-15 ENCOUNTER — Other Ambulatory Visit: Payer: Self-pay | Admitting: Family Medicine

## 2021-11-15 DIAGNOSIS — I10 Essential (primary) hypertension: Secondary | ICD-10-CM

## 2022-06-30 ENCOUNTER — Other Ambulatory Visit: Payer: Self-pay | Admitting: Family Medicine

## 2022-06-30 DIAGNOSIS — I10 Essential (primary) hypertension: Secondary | ICD-10-CM

## 2022-12-28 ENCOUNTER — Other Ambulatory Visit: Payer: Self-pay | Admitting: Family Medicine

## 2022-12-28 DIAGNOSIS — I10 Essential (primary) hypertension: Secondary | ICD-10-CM

## 2023-01-06 ENCOUNTER — Ambulatory Visit: Payer: BC Managed Care – PPO | Admitting: Family Medicine

## 2023-01-13 ENCOUNTER — Ambulatory Visit: Payer: BC Managed Care – PPO | Admitting: Family Medicine

## 2023-01-26 ENCOUNTER — Ambulatory Visit: Payer: BC Managed Care – PPO | Admitting: Family Medicine

## 2023-01-26 ENCOUNTER — Encounter: Payer: Self-pay | Admitting: Family Medicine

## 2023-01-26 VITALS — BP 134/84 | HR 76 | Ht 63.0 in | Wt 149.1 lb

## 2023-01-26 DIAGNOSIS — E559 Vitamin D deficiency, unspecified: Secondary | ICD-10-CM

## 2023-01-26 DIAGNOSIS — D500 Iron deficiency anemia secondary to blood loss (chronic): Secondary | ICD-10-CM | POA: Diagnosis not present

## 2023-01-26 DIAGNOSIS — I10 Essential (primary) hypertension: Secondary | ICD-10-CM

## 2023-01-26 DIAGNOSIS — D649 Anemia, unspecified: Secondary | ICD-10-CM

## 2023-01-26 DIAGNOSIS — N920 Excessive and frequent menstruation with regular cycle: Secondary | ICD-10-CM | POA: Diagnosis not present

## 2023-01-26 DIAGNOSIS — E663 Overweight: Secondary | ICD-10-CM

## 2023-01-26 NOTE — Patient Instructions (Signed)
F/u in 18 to 20 weeks, re evaluate general health and bP, call if you need me sooner  Labs today , cBC, lipid, cmp and eGFR, tSH and vit d  bP goal is under 130/80   It is important that you exercise regularly at least 30 minutes 5 times a week. If you develop chest pain, have severe difficulty breathing, or feel very tired, stop exercising immediately and seek medical attention     Need tdap and flu vaccines and covid vaccine recommended  for every body  Thanks for choosing [facility], we consider it a privelige to serve you.  Gyne name : Sherron Monday, MD, [facility] Pap , Oct 14, 2022, normal Mammogram May9, 2024  2 of 5 day cycle ver heavy

## 2023-01-27 ENCOUNTER — Other Ambulatory Visit: Payer: Self-pay

## 2023-01-27 DIAGNOSIS — D649 Anemia, unspecified: Secondary | ICD-10-CM

## 2023-01-27 DIAGNOSIS — I10 Essential (primary) hypertension: Secondary | ICD-10-CM

## 2023-01-27 LAB — CMP14+EGFR
ALT: 15 IU/L (ref 0–32)
AST: 26 IU/L (ref 0–40)
Albumin: 4.6 g/dL (ref 3.9–4.9)
Alkaline Phosphatase: 84 IU/L (ref 44–121)
BUN/Creatinine Ratio: 13 (ref 9–23)
BUN: 9 mg/dL (ref 6–24)
Bilirubin Total: 0.3 mg/dL (ref 0.0–1.2)
CO2: 19 mmol/L — ABNORMAL LOW (ref 20–29)
Calcium: 8.9 mg/dL (ref 8.7–10.2)
Chloride: 101 mmol/L (ref 96–106)
Creatinine, Ser: 0.67 mg/dL (ref 0.57–1.00)
Globulin, Total: 3.4 g/dL (ref 1.5–4.5)
Glucose: 85 mg/dL (ref 70–99)
Potassium: 3.8 mmol/L (ref 3.5–5.2)
Sodium: 136 mmol/L (ref 134–144)
Total Protein: 8 g/dL (ref 6.0–8.5)
eGFR: 112 mL/min/{1.73_m2} (ref >59)

## 2023-01-27 LAB — CBC
Hematocrit: 27.5 % — ABNORMAL LOW (ref 34.0–46.6)
Hemoglobin: 7.5 g/dL — ABNORMAL LOW (ref 11.1–15.9)
MCH: 16.8 pg — ABNORMAL LOW (ref 26.6–33.0)
MCHC: 27.3 g/dL — ABNORMAL LOW (ref 31.5–35.7)
MCV: 62 fL — ABNORMAL LOW (ref 79–97)
Platelets: 484 10*3/uL — ABNORMAL HIGH (ref 150–450)
RBC: 4.46 x10E6/uL (ref 3.77–5.28)
RDW: 19.2 % — ABNORMAL HIGH (ref 11.7–15.4)
WBC: 7.5 10*3/uL (ref 3.4–10.8)

## 2023-01-27 LAB — LIPID PANEL
Chol/HDL Ratio: 4.3 ratio (ref 0.0–4.4)
Cholesterol, Total: 213 mg/dL — ABNORMAL HIGH (ref 100–199)
HDL: 49 mg/dL (ref >39)
LDL Chol Calc (NIH): 144 mg/dL — ABNORMAL HIGH (ref 0–99)
Triglycerides: 113 mg/dL (ref 0–149)
VLDL Cholesterol Cal: 20 mg/dL (ref 5–40)

## 2023-01-27 LAB — VITAMIN D 25 HYDROXY (VIT D DEFICIENCY, FRACTURES): Vit D, 25-Hydroxy: 11.6 ng/mL — ABNORMAL LOW (ref 30.0–100.0)

## 2023-01-27 LAB — TSH: TSH: 0.464 u[IU]/mL (ref 0.450–4.500)

## 2023-01-27 MED ORDER — VITAMIN D (ERGOCALCIFEROL) 1.25 MG (50000 UNIT) PO CAPS: 50000.0000 [IU] | ORAL_CAPSULE | ORAL | 2 refills | Status: AC

## 2023-01-29 ENCOUNTER — Encounter: Payer: Self-pay | Admitting: Family Medicine

## 2023-01-29 DIAGNOSIS — N920 Excessive and frequent menstruation with regular cycle: Secondary | ICD-10-CM | POA: Insufficient documentation

## 2023-01-29 DIAGNOSIS — E663 Overweight: Secondary | ICD-10-CM | POA: Insufficient documentation

## 2023-01-29 NOTE — Assessment & Plan Note (Signed)
Updated lab needed at/ before next visit. Start iron 32 mg twice daily Gyne to re evaluate re management of heavy menses

## 2023-01-29 NOTE — Assessment & Plan Note (Signed)
Sub optimal control DASH diet and commitment to daily physical activity for a minimum of 30 minutes discussed and encouraged, as a part of hypertension management. The importance of attaining a healthy weight is also discussed.     01/26/2023    4:30 PM 01/26/2023    3:49 PM 08/27/2021    5:01 PM 08/27/2021    5:00 PM 07/08/2020    8:21 AM 10/18/2018    3:29 PM 07/29/2018    5:30 AM  BP/Weight  Systolic BP 134 124  149 127 134 136  Diastolic BP 84 72  80 84 64 64  Wt. (Lbs)  149.08 148   141   BMI  26.41 kg/m2 26.22 kg/m2   24.98 kg/m2      Re eval in 18 weeks

## 2023-01-29 NOTE — Assessment & Plan Note (Signed)
  Patient re-educated about  the importance of commitment to a  minimum of 150 minutes of exercise per week as able.  The importance of healthy food choices with portion control discussed, as well as eating regularly and within a 12 hour window most days. The need to choose "clean , green" food 50 to 75% of the time is discussed, as well as to make water the primary drink and set a goal of 64 ounces water daily.       01/26/2023    3:49 PM 08/27/2021    5:01 PM 10/18/2018    3:29 PM  Weight /BMI  Weight 149 lb 1.3 oz 148 lb 141 lb  Height 5\' 3"  (1.6 m) 5\' 3"  (1.6 m) 5\' 3"  (1.6 m)  BMI 26.41 kg/m2 26.22 kg/m2 24.98 kg/m2

## 2023-01-29 NOTE — Assessment & Plan Note (Signed)
Needs urgent Gyne consult due to moderately severe anemia

## 2023-01-29 NOTE — Assessment & Plan Note (Signed)
Low vit D, needs to start once weekly supplement

## 2023-01-29 NOTE — Progress Notes (Signed)
[name]     MRN: [id number]      DOB: 1981-03-21  Chief Complaint  Patient presents with   Follow-up    Bp follow up    HPI Ms. Desai is here for follow up and re-evaluation of chronic medical conditions, medication management and review of any available recent lab and radiology data.  Preventive health is updated, specifically  Cancer screening and Immunization.   Questions or concerns regarding consultations or procedures which the PT has had in the interim are  addressed. The PT denies any adverse reactions to current medications since the last visit.  There are no new concerns.  There are no specific complaints   ROS Denies recent fever or chills. Denies sinus pressure, nasal congestion, ear pain or sore throat. Denies chest congestion, productive cough or wheezing. Denies chest pains, palpitations and leg swelling Denies abdominal pain, nausea, vomiting,diarrhea or constipation.   Denies dysuria, frequency, hesitancy or incontinence. Denies joint pain, swelling and limitation in mobility. Denies headaches, seizures, numbness, or tingling. Denies depression, anxiety or insomnia. Denies skin break down or rash.   PE  BP 134/84   Pulse 76   Ht 5\' 3"  (1.6 m)   Wt 149 lb 1.3 oz (67.6 kg)   SpO2 100%   BMI 26.41 kg/m   Patient alert and oriented and in no cardiopulmonary distress.  HEENT: No facial asymmetry, EOMI,     Neck supple .  Chest: Clear to auscultation bilaterally.  CVS: S1, S2 no murmurs, no S3.Regular rate.  ABD: Soft non tender.   Ext: No edema  MS: Adequate ROM spine, shoulders, hips and knees.  Skin: Intact, no ulcerations or rash noted.  Psych: Good eye contact, normal affect. Memory intact not anxious or depressed appearing.  CNS: CN 2-12 intact, power,  normal throughout.no focal deficits noted.   Assessment & Plan  HTN (hypertension) Sub optimal control DASH diet and commitment to daily physical activity for a minimum of 30  minutes discussed and encouraged, as a part of hypertension management. The importance of attaining a healthy weight is also discussed.     01/26/2023    4:30 PM 01/26/2023    3:49 PM 08/27/2021    5:01 PM 08/27/2021    5:00 PM 07/08/2020    8:21 AM 10/18/2018    3:29 PM 07/29/2018    5:30 AM  BP/Weight  Systolic BP 134 124  149 127 134 136  Diastolic BP 84 72  80 84 64 64  Wt. (Lbs)  149.08 148   141   BMI  26.41 kg/m2 26.22 kg/m2   24.98 kg/m2      Re eval in 18 weeks  IDA (iron deficiency anemia) Updated lab needed at/ before next visit. Start iron 32 mg twice daily Gyne to re evaluate re management of heavy menses  Vitamin d deficiency Low vit D, needs to start once weekly supplement  Heavy menses Needs urgent Gyne consult due to moderately severe anemia  Overweight (BMI 25.0-29.9)  Patient re-educated about  the importance of commitment to a  minimum of 150 minutes of exercise per week as able.  The importance of healthy food choices with portion control discussed, as well as eating regularly and within a 12 hour window most days. The need to choose "clean , green" food 50 to 75% of the time is discussed, as well as to make water the primary drink and set a goal of 64 ounces water daily.  01/26/2023    3:49 PM 08/27/2021    5:01 PM 10/18/2018    3:29 PM  Weight /BMI  Weight 149 lb 1.3 oz 148 lb 141 lb  Height 5\' 3"  (1.6 m) 5\' 3"  (1.6 m) 5\' 3"  (1.6 m)  BMI 26.41 kg/m2 26.22 kg/m2 24.98 kg/m2

## 2023-02-05 LAB — VITAMIN B12

## 2023-02-05 LAB — IRON

## 2023-02-05 LAB — FERRITIN

## 2023-02-05 LAB — SPECIMEN STATUS REPORT

## 2023-03-27 ENCOUNTER — Other Ambulatory Visit: Payer: Self-pay | Admitting: Family Medicine

## 2023-03-27 DIAGNOSIS — I10 Essential (primary) hypertension: Secondary | ICD-10-CM

## 2023-06-10 ENCOUNTER — Ambulatory Visit: Payer: BC Managed Care – PPO | Admitting: Family Medicine

## 2023-09-10 ENCOUNTER — Other Ambulatory Visit: Payer: Self-pay | Admitting: Family Medicine

## 2023-09-10 DIAGNOSIS — I10 Essential (primary) hypertension: Secondary | ICD-10-CM

## 2024-04-23 ENCOUNTER — Other Ambulatory Visit: Payer: Self-pay | Admitting: Family Medicine

## 2024-04-23 DIAGNOSIS — I10 Essential (primary) hypertension: Secondary | ICD-10-CM
